# Patient Record
Sex: Female | Born: 1935 | Race: White | Hispanic: No | Smoking: Former smoker
Health system: Southern US, Community
[De-identification: ages and names within clinical notes are randomized; demographics above are authoritative.]

## PROBLEM LIST (undated history)

## (undated) DIAGNOSIS — R87619 Unspecified abnormal cytological findings in specimens from cervix uteri: Secondary | ICD-10-CM

## (undated) DIAGNOSIS — M2540 Effusion, unspecified joint: Secondary | ICD-10-CM

## (undated) DIAGNOSIS — R51 Headache: Secondary | ICD-10-CM

## (undated) DIAGNOSIS — K5790 Diverticulosis of intestine, part unspecified, without perforation or abscess without bleeding: Secondary | ICD-10-CM

## (undated) DIAGNOSIS — M2550 Pain in unspecified joint: Secondary | ICD-10-CM

## (undated) DIAGNOSIS — L299 Pruritus, unspecified: Secondary | ICD-10-CM

## (undated) DIAGNOSIS — M7071 Other bursitis of hip, right hip: Secondary | ICD-10-CM

## (undated) DIAGNOSIS — F329 Major depressive disorder, single episode, unspecified: Secondary | ICD-10-CM

## (undated) DIAGNOSIS — C44310 Basal cell carcinoma of skin of unspecified parts of face: Secondary | ICD-10-CM

## (undated) DIAGNOSIS — M549 Dorsalgia, unspecified: Secondary | ICD-10-CM

## (undated) DIAGNOSIS — A498 Other bacterial infections of unspecified site: Secondary | ICD-10-CM

## (undated) DIAGNOSIS — A048 Other specified bacterial intestinal infections: Secondary | ICD-10-CM

## (undated) DIAGNOSIS — L853 Xerosis cutis: Secondary | ICD-10-CM

## (undated) DIAGNOSIS — K219 Gastro-esophageal reflux disease without esophagitis: Secondary | ICD-10-CM

## (undated) DIAGNOSIS — M8580 Other specified disorders of bone density and structure, unspecified site: Secondary | ICD-10-CM

## (undated) DIAGNOSIS — K635 Polyp of colon: Secondary | ICD-10-CM

## (undated) DIAGNOSIS — C439 Malignant melanoma of skin, unspecified: Secondary | ICD-10-CM

## (undated) DIAGNOSIS — F419 Anxiety disorder, unspecified: Secondary | ICD-10-CM

## (undated) DIAGNOSIS — F32A Depression, unspecified: Secondary | ICD-10-CM

## (undated) DIAGNOSIS — M1990 Unspecified osteoarthritis, unspecified site: Secondary | ICD-10-CM

## (undated) DIAGNOSIS — M25551 Pain in right hip: Secondary | ICD-10-CM

## (undated) HISTORY — PX: TONSILLECTOMY: SHX5217

## (undated) HISTORY — PX: OTHER SURGICAL HISTORY: SHX169

## (undated) HISTORY — DX: Other bursitis of hip, right hip: M70.71

## (undated) HISTORY — DX: Pain in right hip: M25.551

## (undated) HISTORY — DX: Polyp of colon: K63.5

## (undated) HISTORY — DX: Diverticulosis of intestine, part unspecified, without perforation or abscess without bleeding: K57.90

## (undated) HISTORY — DX: Gastro-esophageal reflux disease without esophagitis: K21.9

## (undated) HISTORY — DX: Malignant melanoma of skin, unspecified: C43.9

## (undated) HISTORY — DX: Other bacterial infections of unspecified site: A49.8

## (undated) HISTORY — DX: Other specified disorders of bone density and structure, unspecified site: M85.80

## (undated) HISTORY — DX: Other specified bacterial intestinal infections: A04.8

## (undated) HISTORY — DX: Major depressive disorder, single episode, unspecified: F32.9

## (undated) HISTORY — DX: Unspecified abnormal cytological findings in specimens from cervix uteri: R87.619

## (undated) HISTORY — DX: Depression, unspecified: F32.A

## (undated) HISTORY — DX: Basal cell carcinoma of skin of unspecified parts of face: C44.310

---

## 1967-09-03 HISTORY — PX: HIP FRACTURE SURGERY: SHX118

## 1988-09-02 DIAGNOSIS — R87619 Unspecified abnormal cytological findings in specimens from cervix uteri: Secondary | ICD-10-CM

## 1988-09-02 HISTORY — DX: Unspecified abnormal cytological findings in specimens from cervix uteri: R87.619

## 1998-09-02 HISTORY — PX: MELANOMA EXCISION: SHX5266

## 1998-10-20 ENCOUNTER — Other Ambulatory Visit: Admission: RE | Admit: 1998-10-20 | Discharge: 1998-10-20 | Payer: Self-pay | Admitting: Family Medicine

## 1998-11-16 ENCOUNTER — Encounter: Payer: Self-pay | Admitting: Obstetrics and Gynecology

## 1998-11-16 ENCOUNTER — Ambulatory Visit (HOSPITAL_COMMUNITY): Admission: RE | Admit: 1998-11-16 | Discharge: 1998-11-16 | Payer: Self-pay | Admitting: Obstetrics and Gynecology

## 1999-09-03 DIAGNOSIS — C439 Malignant melanoma of skin, unspecified: Secondary | ICD-10-CM

## 1999-09-03 HISTORY — DX: Malignant melanoma of skin, unspecified: C43.9

## 1999-11-09 ENCOUNTER — Encounter: Admission: RE | Admit: 1999-11-09 | Discharge: 1999-11-09 | Payer: Self-pay | Admitting: Family Medicine

## 1999-11-09 ENCOUNTER — Encounter: Payer: Self-pay | Admitting: Family Medicine

## 2002-11-24 ENCOUNTER — Other Ambulatory Visit: Admission: RE | Admit: 2002-11-24 | Discharge: 2002-11-24 | Payer: Self-pay | Admitting: Unknown Physician Specialty

## 2003-09-03 DIAGNOSIS — A048 Other specified bacterial intestinal infections: Secondary | ICD-10-CM

## 2003-09-03 HISTORY — DX: Other specified bacterial intestinal infections: A04.8

## 2003-10-10 ENCOUNTER — Other Ambulatory Visit: Admission: RE | Admit: 2003-10-10 | Discharge: 2003-10-10 | Payer: Self-pay | Admitting: Family Medicine

## 2005-08-08 ENCOUNTER — Ambulatory Visit: Payer: Self-pay | Admitting: Internal Medicine

## 2005-09-02 DIAGNOSIS — K5790 Diverticulosis of intestine, part unspecified, without perforation or abscess without bleeding: Secondary | ICD-10-CM

## 2005-09-02 DIAGNOSIS — K635 Polyp of colon: Secondary | ICD-10-CM

## 2005-09-02 HISTORY — DX: Diverticulosis of intestine, part unspecified, without perforation or abscess without bleeding: K57.90

## 2005-09-02 HISTORY — DX: Polyp of colon: K63.5

## 2005-09-05 ENCOUNTER — Ambulatory Visit: Payer: Self-pay | Admitting: Internal Medicine

## 2005-09-05 ENCOUNTER — Encounter (INDEPENDENT_AMBULATORY_CARE_PROVIDER_SITE_OTHER): Payer: Self-pay | Admitting: *Deleted

## 2005-09-05 HISTORY — PX: ESOPHAGOGASTRODUODENOSCOPY: SHX1529

## 2005-09-05 LAB — HM COLONOSCOPY

## 2007-03-09 ENCOUNTER — Other Ambulatory Visit: Admission: RE | Admit: 2007-03-09 | Discharge: 2007-03-09 | Payer: Self-pay | Admitting: Family Medicine

## 2009-08-15 HISTORY — PX: COLONOSCOPY: SHX174

## 2009-08-15 LAB — HM COLONOSCOPY

## 2010-03-12 ENCOUNTER — Other Ambulatory Visit: Admission: RE | Admit: 2010-03-12 | Discharge: 2010-03-12 | Payer: Self-pay | Admitting: Family Medicine

## 2010-05-21 ENCOUNTER — Ambulatory Visit: Payer: Self-pay | Admitting: Obstetrics and Gynecology

## 2010-05-22 ENCOUNTER — Ambulatory Visit: Payer: Self-pay | Admitting: Obstetrics and Gynecology

## 2011-02-01 DIAGNOSIS — C44310 Basal cell carcinoma of skin of unspecified parts of face: Secondary | ICD-10-CM

## 2011-02-01 HISTORY — DX: Basal cell carcinoma of skin of unspecified parts of face: C44.310

## 2011-02-01 HISTORY — PX: MOHS SURGERY: SHX181

## 2011-03-20 ENCOUNTER — Encounter: Payer: Self-pay | Admitting: Family Medicine

## 2011-03-20 ENCOUNTER — Ambulatory Visit (INDEPENDENT_AMBULATORY_CARE_PROVIDER_SITE_OTHER): Payer: Medicare Other | Admitting: Family Medicine

## 2011-03-20 VITALS — BP 138/78 | HR 68 | Ht 65.0 in | Wt 131.0 lb

## 2011-03-20 DIAGNOSIS — E7800 Pure hypercholesterolemia, unspecified: Secondary | ICD-10-CM | POA: Insufficient documentation

## 2011-03-20 DIAGNOSIS — M76899 Other specified enthesopathies of unspecified lower limb, excluding foot: Secondary | ICD-10-CM

## 2011-03-20 DIAGNOSIS — M7061 Trochanteric bursitis, right hip: Secondary | ICD-10-CM

## 2011-03-20 DIAGNOSIS — F329 Major depressive disorder, single episode, unspecified: Secondary | ICD-10-CM

## 2011-03-20 LAB — COMPREHENSIVE METABOLIC PANEL
AST: 22 U/L (ref 0–37)
Albumin: 3.9 g/dL (ref 3.5–5.2)
Alkaline Phosphatase: 43 U/L (ref 39–117)
BUN: 18 mg/dL (ref 6–23)
Calcium: 9.1 mg/dL (ref 8.4–10.5)
Chloride: 103 mEq/L (ref 96–112)
Glucose, Bld: 81 mg/dL (ref 70–99)
Potassium: 4 mEq/L (ref 3.5–5.3)
Sodium: 139 mEq/L (ref 135–145)
Total Protein: 6.5 g/dL (ref 6.0–8.3)

## 2011-03-20 LAB — LIPID PANEL
HDL: 101 mg/dL (ref >39)
LDL Cholesterol: 114 mg/dL — ABNORMAL HIGH (ref 0–99)

## 2011-03-20 MED ORDER — FLUOXETINE HCL 40 MG PO CAPS: 40.0000 mg | ORAL_CAPSULE | Freq: Every day | ORAL | Status: DC

## 2011-03-20 NOTE — Progress Notes (Signed)
Patient presents to re-establish care. She has a history of depression, and has been on 40 mg of the Prozac. She believes her dose was increased from 20 to 40 mg 3 years ago (related to her husband's illness).  Lately she has been having "isolated epsodes of depression"--unsure if related to passing of her husband, or issues with someone she is dating now.  Doesn't cry, but gets irritable (usually regarding his sexual demands), then feels guilty afterwards.  She uses 1/2 Valium maybe once every 3 months, for anxiety, sometimes more.  Feels more comfortable having the bottle of medication on hand (requesting #30) even though she doesn't use it too often.  She is also requesting labs to be done, she is fasting today.  She has a h/o high total cholesterol, with high HDL and borderline LDL's in the 130's.  Her last CPE was a year ago.  No records received yet from [place] at [facility].  She saw Dr. Pete Glatter once with hip pain. She was referred to Dr. Jerl Santos who ordered hip x-rays and gave her a cortisone shot for bursitis. She had some improvement, but still having some pain.  Would prefer to not have to go back to Dr. Jerl Santos.  She likes the idea of "one stop shopping" with me.  Past Medical History  Diagnosis Date  . Melanoma 2001    back  . BCC (basal cell carcinoma), face 02/2011    nose  . Depression   . Bursitis of hip, right 04/2010    s/p cortisone shot (Dr. Jerl Santos)  . GERD (gastroesophageal reflux disease)   . Allergy   . Hearing loss     wears hearing aides     Past Surgical History  Procedure Date  . Mohs surgery 02/2011    for [facility]  . Melanoma excision 2001    at [facility]    History   Social History  . Marital Status: Widowed    Spouse Name: N/A    Number of Children: N/A  . Years of Education: N/A   Occupational History  . Not on file.   Social History Main Topics  . Smoking status: Former Smoker    Quit date: 09/02/1984  . Smokeless tobacco: Not on file  . Alcohol Use: Yes       1 drink per day  . Drug Use: No  . Sexually Active: Not on file   Other Topics Concern  . Not on file   Social History Narrative  . No narrative on file    Family History  Problem Relation Age of Onset  . Heart disease Father 21    MI  . Diabetes Neg Hx   . Cancer Neg Hx     Current outpatient prescriptions:diazepam (VALIUM) 5 MG tablet, Take 5 mg by mouth as needed.  , Disp: , Rfl: ;  FLUoxetine (PROZAC) 40 MG capsule, Take 1 capsule (40 mg total) by mouth daily., Disp: 90 capsule, Rfl: 3;  Multiple Vitamins-Minerals (MULTIVITAMIN WITH MINERALS) tablet, Take 1 tablet by mouth daily.  , Disp: , Rfl: ;  omeprazole (PRILOSEC) 20 MG capsule, Take 20 mg by mouth as needed.  , Disp: , Rfl:   No Known Allergies  ROS:  Denies headaches, chest pain, URI symptoms, SOB, other joint pains, swelling, GI complaints or other concerns  PHYSICAL EXAM:  Well developed, well nourished patient, in no distress BP 138/78  Pulse 68  Ht 5\' 5"  (1.651 m)  Wt 131 lb (59.421 kg)  BMI  21.80 kg/m2 HEENT: PERRL, EOMI, conjunctiva and sclera clear Neck: No lymphadenopathy or thyromegaly, no carotid bruit Heart:  Regular rate and rhythm, no murmurs, rubs, gallops or ectopy Lungs:  Clear bilaterally, without wheezes, rales or ronchi Abdomen:  Soft, nontender, nondistended, no hepatosplenomegaly or masses, normal bowel sounds Extremities:  No clubbing, cyanosis or edema, 2+ pulses. Tender over R trochanteric bursa Neuro:  Alert and oriented x 3, cranial nerves grossly intact.  DTR's 2+ and symmetric.  Normal strength and sensation Back:  No spine or CVA tenderness Skin: no rashes or suspicious lesions Psych:  Normal mood, affect, hygiene and grooming, normal speech, eye contact  ASSESSMENT/PLAN: 1. Depressive disorder, not elsewhere classified  FLUoxetine (PROZAC) 40 MG capsule   Stable overall.  Some relationship stress--continue open lines of communication with partner to work out issues.   Consider adding Wellbutrin if worse  2. Trochanteric bursitis of right hip     s/p 1 cortisone injection, with residual symptoms  3. Pure hypercholesterolemia  Comprehensive metabolic panel, Lipid panel   high HDL, borderline LDL (130's)   Patient is to call with the dose of her diazepam (wasn't sure if 5 or 10mg , and didn't want me to send in Rx for 5mg  without checking).  Consider adding Wellbutrin to the Prozac 40mg  if having ongoing problems with irritability, guilt, sadness.   F/u for CPE; to sign release for records from [place].  She may return sooner for cortisone injection for bursitis if she desires.

## 2011-03-20 NOTE — Patient Instructions (Signed)
Call with your valium dose when you get home (is it 5 or 10 mg)

## 2011-03-21 ENCOUNTER — Ambulatory Visit (INDEPENDENT_AMBULATORY_CARE_PROVIDER_SITE_OTHER): Payer: Medicare Other | Admitting: Family Medicine

## 2011-03-21 ENCOUNTER — Encounter: Payer: Self-pay | Admitting: Family Medicine

## 2011-03-21 VITALS — BP 118/72 | HR 68 | Ht 65.0 in | Wt 131.0 lb

## 2011-03-21 DIAGNOSIS — M76899 Other specified enthesopathies of unspecified lower limb, excluding foot: Secondary | ICD-10-CM

## 2011-03-21 DIAGNOSIS — F411 Generalized anxiety disorder: Secondary | ICD-10-CM

## 2011-03-21 DIAGNOSIS — M7061 Trochanteric bursitis, right hip: Secondary | ICD-10-CM

## 2011-03-21 MED ORDER — DIAZEPAM 10 MG PO TABS: 5.0000 mg | ORAL_TABLET | Freq: Three times a day (TID) | ORAL | Status: DC | PRN

## 2011-03-21 NOTE — Progress Notes (Signed)
  Subjective:    Patient ID: Tiffany Padilla, female    DOB: May 02, 1934, 77 y.o.   MRN: [id number]  HPI Patient presents for cortisone shot to treat bursitis in her R hip.  She had never called back with Valium dose.  She states she has 5mg  Rx at home, but that was told there was no difference in price for 10mg , so she'd rather have 10mg  dose and will cut tabs in half to save money.   Review of Systems Denies fevers, pain with weight bearing. Denies GI complaints, other joint pains, skin rashes, or other concerns    Objective:   Physical Exam BP 118/72  Pulse 68  Ht 5\' 5"  (1.651 m)  Wt 131 lb (59.421 kg)  BMI 21.80 kg/m2 Pleasant elderly female in no distress +TTP over R trochanteric bursa  Verbal consent was obtained after risks/benefits and alternatives were reviewed with patient, and she wished to proceed.  20mg  of Kenalog, along with 1cc of 2% lidocaine without epi and 1cc of Marcaine were injected into R trochanteric bursa (after applying Ethyl Chloride).  Patient tolerated the procedure well       Assessment & Plan:   1. Trochanteric bursitis of right hip   2. Anxiety state, unspecified      Discussed normal time frame of improvement, in relation to the 3 meds injected, and that it would take 7-14 days steroid to have full effect.  If she has incomplete improvement, or complete resolution but then gets recurrent pain, discussed use of NSAIDS first, prior to pain getting bad enough to warrant an injection.  She has a h/o PUD, so discussed using H2 blocker or PPI along with NSAID to protect stomach, if NSAIDS are needed in future.  Patient expresses understanding  Anxiety--Advised that quantity of diazepam would be decreased to #15, reflecting still 30 doses (which is what she previously has been rx'd--#30 of 5mg ).  Will fax to her mail order pharmacy as requested

## 2011-03-21 NOTE — Patient Instructions (Signed)
Hip Bursitis Bursitis is a swelling and soreness (inflammation) of a fluid-filled sac (bursa). This sac overlies and protects the joints.  CAUSES Bursitis can be caused by:  Injury.   Overuse of the muscles surrounding the joint.   Arthritis.   Gout.   Infection.   Cold weather.   Inadequate warm-up and conditioning prior to activities  The cause may be unknown. In the hip, it is a soreness of the bursa surrounding either of the big knobs of bone (trochanters) at the top of the thigh bone (femur).  SYMPTOMS Bursitis may vary from mild irritation to an abscess (also called a boil or furuncle). This can cause excruciating pain. The joints most likely to be affected by bursitis are the elbows, shoulders, hips and knees. Symptoms usually lessen in 3 to 4 weeks with treatment, but often come back. DIAGNOSIS Bursitis in the hip usually begins as tenderness and swelling over the outside of the hip. There is also pain with motion of the hip. If the bursa becomes infected, a temperature may be present. Redness and tenderness and warmth will develop over the hip. HOME CARE INSTRUCTIONS  Apply ice to the affected area for 10 minutes every 3-4 hours while awake for the first 2 days. Put the ice in a plastic bag and place a towel between the bag of ice and your skin.   Rest the painful joint as much as possible, but continue to put the joint through a normal range of motion at least four times per day. When the pain lessens, begin normal, slow movements and usual activities to help prevent stiffness of the hip.   Only take over-the-counter or prescription medicines for pain, discomfort, or fever as directed by your caregiver.   Use crutches to limit weight bearing on the hip joint, if advised.   Elevate your painful hip to reduce swelling. Use pillows for propping and cushioning your legs and hips.   Gentle massage may provide comfort and decrease swelling.   If conservative treatment does  not work, your caregiver may advise draining the bursa and injecting cortisone into the area. This may speed up the healing process. This may also be used as an initial treatment of choice.  SEEK IMMEDIATE MEDICAL CARE IF:  Your pain increases even during treatment, or you are not improving.   An unexplained oral temperature above 101 develops.   You have heat and inflammation over the involved bursa.   You have any other questions or concerns.  MAKE SURE YOU:   Understand these instructions.   Will watch your condition.   Will get help right away if you are not doing well or get worse.  Document Released: 02/08/2002 Document Re-Released: 11/15/2008 [facility] Patient Information 2011 [place], [place].

## 2011-04-29 ENCOUNTER — Encounter: Payer: Self-pay | Admitting: Family Medicine

## 2011-05-08 ENCOUNTER — Encounter: Payer: Self-pay | Admitting: *Deleted

## 2011-05-13 ENCOUNTER — Ambulatory Visit (INDEPENDENT_AMBULATORY_CARE_PROVIDER_SITE_OTHER): Payer: Medicare Other | Admitting: Family Medicine

## 2011-05-13 ENCOUNTER — Encounter: Payer: Self-pay | Admitting: Family Medicine

## 2011-05-13 VITALS — BP 112/70 | HR 64 | Ht 65.0 in | Wt 131.0 lb

## 2011-05-13 DIAGNOSIS — Z23 Encounter for immunization: Secondary | ICD-10-CM

## 2011-05-13 DIAGNOSIS — Z0000 Encounter for general adult medical examination without abnormal findings: Secondary | ICD-10-CM

## 2011-05-13 DIAGNOSIS — D126 Benign neoplasm of colon, unspecified: Secondary | ICD-10-CM | POA: Insufficient documentation

## 2011-05-13 DIAGNOSIS — F329 Major depressive disorder, single episode, unspecified: Secondary | ICD-10-CM

## 2011-05-13 LAB — POCT URINALYSIS DIPSTICK
Glucose, UA: NEGATIVE
Leukocytes, UA: NEGATIVE
Nitrite, UA: POSITIVE
Protein, UA: NEGATIVE
Urobilinogen, UA: NEGATIVE

## 2011-05-13 NOTE — Patient Instructions (Addendum)
HEALTH MAINTENANCE RECOMMENDATIONS:  It is recommended that you get at least 30 minutes of aerobic exercise at least 5 days/week (for weight loss, you may need as much as 60-90 minutes). This can be any activity that gets your heart rate up. This can be divided in 10-15 minute intervals if needed, but try and build up your endurance at least once a week.  Weight bearing exercise is also recommended twice weekly.  Eat a healthy diet with lots of vegetables, fruits and fiber.  "Colorful" foods have a lot of vitamins (ie green vegetables, tomatoes, red peppers, etc).  Limit sweet tea, regular sodas and alcoholic beverages, all of which has a lot of calories and sugar.  Up to 1 alcoholic drink daily may be beneficial for women (unless trying to lose weight, watch sugars).  Drink a lot of water.  Calcium recommendations are 1200-1500 mg daily (1500 mg for postmenopausal women or women without ovaries), and vitamin D 1000 IU daily.  This should be obtained from diet and/or supplements (vitamins), and calcium should not be taken all at once, but in divided doses.  Monthly self breast exams and yearly mammograms for women over the age of 46 is recommended.  Sunscreen of at least SPF 30 should be used on all sun-exposed parts of the skin when outside between the hours of 10 am and 4 pm (not just when at beach or pool, but even with exercise, golf, tennis, and yard work!)  Use a sunscreen that says "broad spectrum" so it covers both UVA and UVB rays, and make sure to reapply every 1-2 hours.  Remember to change the batteries in your smoke detectors when changing your clock times in the spring and fall.  Use your seat belt every time you are in a car, and please drive safely and not be distracted with cell phones and texting while driving.   Counseling is recommended--I will give you the card for Malvin Johns she doesn't take your insurance, then check with your insurance who in town takes your  insurance.  Consider getting TdaP vaccines (tetanus booster with pertussis booster also)--no need to wait for 10 years from your last Tetanus shot.  Mediterranean diets may be helpful in preventing Alzheimers disease and stroke.  Consider yearly mammograms, as recommended
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
## 2011-06-28 ENCOUNTER — Telehealth: Payer: Self-pay | Admitting: Family Medicine

## 2011-07-01 ENCOUNTER — Other Ambulatory Visit: Payer: Self-pay | Admitting: Family Medicine

## 2011-07-01 ENCOUNTER — Other Ambulatory Visit: Payer: Self-pay | Admitting: *Deleted

## 2011-07-01 DIAGNOSIS — K219 Gastro-esophageal reflux disease without esophagitis: Secondary | ICD-10-CM

## 2011-07-01 MED ORDER — OMEPRAZOLE 20 MG PO CPDR: 20.0000 mg | DELAYED_RELEASE_CAPSULE | Freq: Every day | ORAL | Status: DC

## 2011-07-05 NOTE — Telephone Encounter (Signed)
CLOSED BC 1 WK OLD

## 2011-11-11 ENCOUNTER — Ambulatory Visit (INDEPENDENT_AMBULATORY_CARE_PROVIDER_SITE_OTHER): Payer: Medicare Other | Admitting: Family Medicine

## 2011-11-11 ENCOUNTER — Encounter: Payer: Self-pay | Admitting: Family Medicine

## 2011-11-11 VITALS — BP 118/70 | HR 68 | Temp 98.2°F | Ht 65.0 in | Wt 140.0 lb

## 2011-11-11 DIAGNOSIS — M79609 Pain in unspecified limb: Secondary | ICD-10-CM

## 2011-11-11 DIAGNOSIS — M79672 Pain in left foot: Secondary | ICD-10-CM

## 2011-11-11 DIAGNOSIS — J309 Allergic rhinitis, unspecified: Secondary | ICD-10-CM

## 2011-11-11 DIAGNOSIS — M76899 Other specified enthesopathies of unspecified lower limb, excluding foot: Secondary | ICD-10-CM

## 2011-11-11 DIAGNOSIS — M7071 Other bursitis of hip, right hip: Secondary | ICD-10-CM

## 2011-11-11 NOTE — Patient Instructions (Addendum)
Left foot pain--bunion vs component of arthritis.  Recommended conservative treatment with tylenol, wide shoes, avoid heels.  Avoid NSAIDs for now (or use sparingly) due to h/o ulcer in past  Allergies--trial of claritin, or zyrtec or allegra (all over-the-counter).     Hip Bursitis Bursitis is a swelling and soreness (inflammation) of a fluid-filled sac (bursa). This sac overlies and protects the joints.  CAUSES   Injury.   Overuse of the muscles surrounding the joint.   Arthritis.   Gout.   Infection.   Cold weather.   Inadequate warm-up and conditioning prior to activities.  The cause may not be known.  SYMPTOMS   Mild to severe irritation.   Tenderness and swelling over the outside of the hip.   Pain with motion of the hip.   If the bursa becomes infected, a fever may be present. Redness, tenderness, and warmth will develop over the hip.  Symptoms usually lessen in 3 to 4 weeks with treatment, but can come back. TREATMENT If conservative treatment does not work, your caregiver may advise draining the bursa and injecting cortisone into the area. This may speed up the healing process. This may also be used as an initial treatment of choice. HOME CARE INSTRUCTIONS   Apply ice to the affected area for 15 to 20 minutes every 3 to 4 hours while awake for the first 2 days. Put the ice in a plastic bag and place a towel between the bag of ice and your skin.   Rest the painful joint as much as possible, but continue to put the joint through a normal range of motion at least 4 times per day. When the pain lessens, begin normal, slow movements and usual activities to help prevent stiffness of the hip.   Only take over-the-counter or prescription medicines for pain, discomfort, or fever as directed by your caregiver.   Use crutches to limit weight bearing on the hip joint, if advised.   Elevate your painful hip to reduce swelling. Use pillows for propping and cushioning your legs  and hips.   Gentle massage may provide comfort and decrease swelling.  SEEK IMMEDIATE MEDICAL CARE IF:   Your pain increases even during treatment, or you are not improving.   You have a fever.   You have heat and inflammation over the involved bursa.   You have any other questions or concerns.  MAKE SURE YOU:   Understand these instructions.   Will watch your condition.   Will get help right away if you are not doing well or get worse.  Document Released: 02/08/2002 Document Revised: 08/08/2011 Document Reviewed: 09/07/2008 [facility] Patient Information 2012 [place], [place].
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
## 2012-01-30 ENCOUNTER — Telehealth: Payer: Self-pay | Admitting: *Deleted

## 2012-01-30 DIAGNOSIS — K219 Gastro-esophageal reflux disease without esophagitis: Secondary | ICD-10-CM

## 2012-01-30 MED ORDER — OMEPRAZOLE 20 MG PO CPDR: 20.0000 mg | DELAYED_RELEASE_CAPSULE | Freq: Every day | ORAL | Status: DC

## 2012-01-30 NOTE — Telephone Encounter (Signed)
Done

## 2012-01-30 NOTE — Telephone Encounter (Signed)
No--only approve the 20mg .  If she needs stronger due to having problems/symptoms, needs to schedule OV

## 2012-01-30 NOTE — Telephone Encounter (Signed)
Patient called and was requesting her omeprazole be called into [facility], she has been on 20mg  in the past but would like to know if you could call something stronger in for her, perhaps a stronger strength?

## 2012-02-17 ENCOUNTER — Telehealth: Payer: Self-pay | Admitting: *Deleted

## 2012-02-17 NOTE — Telephone Encounter (Signed)
Spoke with patient and she would rather come here as her copay at derm is $40. She is scheduled for Thurs 02/20/12 as she was not able to come in [place].-appt was offered.

## 2012-02-17 NOTE — Telephone Encounter (Signed)
Patient called and stated that she had something removed from her back by her dermatologist and she thinks it may be infected. Wants to know if you would be willing to take a look at it or should she call her derm back?

## 2012-02-17 NOTE — Telephone Encounter (Signed)
My recommendation is to follow up with the dermatologist who did the procedure; however I' d be happy to evaluate here also

## 2012-02-20 ENCOUNTER — Ambulatory Visit (INDEPENDENT_AMBULATORY_CARE_PROVIDER_SITE_OTHER): Payer: Medicare Other | Admitting: Family Medicine

## 2012-02-20 ENCOUNTER — Encounter: Payer: Self-pay | Admitting: Family Medicine

## 2012-02-20 VITALS — BP 124/72 | HR 68 | Ht 65.0 in | Wt 142.0 lb

## 2012-02-20 DIAGNOSIS — Z5189 Encounter for other specified aftercare: Secondary | ICD-10-CM

## 2012-02-20 NOTE — Patient Instructions (Signed)
There is no evidence of infection, just healing.  There will continue to be changes in the wound appearance--the central yellow/hole area should get flatter, and the redness should fade (possibly to a darker color).  If there is increase in pain, redness, size, if there is drainage, swelling, red streaks for fevers, please return for re-evaluation.  It is fine to keep this area uncovered, but clean.  At the first sign of increased redness or oozing, start an antibacterial ointment like bacitracin, and then return here if doesn't resolve.
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
## 2012-04-20 ENCOUNTER — Other Ambulatory Visit: Payer: Self-pay | Admitting: *Deleted

## 2012-04-20 DIAGNOSIS — F329 Major depressive disorder, single episode, unspecified: Secondary | ICD-10-CM

## 2012-04-20 DIAGNOSIS — K219 Gastro-esophageal reflux disease without esophagitis: Secondary | ICD-10-CM

## 2012-04-20 MED ORDER — FLUOXETINE HCL 40 MG PO CAPS: 40.0000 mg | ORAL_CAPSULE | Freq: Every day | ORAL | Status: DC

## 2012-04-20 MED ORDER — OMEPRAZOLE 20 MG PO CPDR: 20.0000 mg | DELAYED_RELEASE_CAPSULE | Freq: Every day | ORAL | Status: DC

## 2012-04-29 ENCOUNTER — Other Ambulatory Visit: Payer: Self-pay | Admitting: *Deleted

## 2012-04-29 DIAGNOSIS — K219 Gastro-esophageal reflux disease without esophagitis: Secondary | ICD-10-CM

## 2012-04-29 MED ORDER — OMEPRAZOLE 20 MG PO CPDR: 20.0000 mg | DELAYED_RELEASE_CAPSULE | Freq: Every day | ORAL | Status: DC

## 2012-06-03 ENCOUNTER — Ambulatory Visit: Payer: Medicare Other | Admitting: Family Medicine

## 2012-06-08 ENCOUNTER — Ambulatory Visit (INDEPENDENT_AMBULATORY_CARE_PROVIDER_SITE_OTHER): Payer: Medicare Other | Admitting: Family Medicine

## 2012-06-08 ENCOUNTER — Encounter: Payer: Self-pay | Admitting: Family Medicine

## 2012-06-08 VITALS — BP 138/86 | HR 76 | Ht 64.25 in | Wt 134.0 lb

## 2012-06-08 DIAGNOSIS — Z0000 Encounter for general adult medical examination without abnormal findings: Secondary | ICD-10-CM

## 2012-06-08 DIAGNOSIS — Z131 Encounter for screening for diabetes mellitus: Secondary | ICD-10-CM

## 2012-06-08 DIAGNOSIS — F329 Major depressive disorder, single episode, unspecified: Secondary | ICD-10-CM

## 2012-06-08 DIAGNOSIS — N390 Urinary tract infection, site not specified: Secondary | ICD-10-CM

## 2012-06-08 DIAGNOSIS — E7800 Pure hypercholesterolemia, unspecified: Secondary | ICD-10-CM

## 2012-06-08 DIAGNOSIS — D126 Benign neoplasm of colon, unspecified: Secondary | ICD-10-CM

## 2012-06-08 DIAGNOSIS — Z23 Encounter for immunization: Secondary | ICD-10-CM

## 2012-06-08 LAB — POCT URINALYSIS DIPSTICK
Nitrite, UA: POSITIVE
Protein, UA: NEGATIVE
Urobilinogen, UA: NEGATIVE
pH, UA: 8

## 2012-06-08 LAB — LIPID PANEL: LDL Cholesterol: 162 mg/dL — ABNORMAL HIGH (ref 0–99)

## 2012-06-08 MED ORDER — FLUOXETINE HCL 40 MG PO CAPS: 40.0000 mg | ORAL_CAPSULE | Freq: Every day | ORAL | Status: DC

## 2012-06-08 MED ORDER — INFLUENZA VIRUS VACC SPLIT PF IM SUSP: 0.5000 mL | Freq: Once | INTRAMUSCULAR | Status: DC

## 2012-06-08 MED ORDER — DIAZEPAM 10 MG PO TABS: 5.0000 mg | ORAL_TABLET | Freq: Three times a day (TID) | ORAL | Status: DC | PRN
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
## 2012-06-08 NOTE — Patient Instructions (Signed)
HEALTH MAINTENANCE RECOMMENDATIONS:  It is recommended that you get at least 30 minutes of aerobic exercise at least 5 days/week (for weight loss, you may need as much as 60-90 minutes). This can be any activity that gets your heart rate up. This can be divided in 10-15 minute intervals if needed, but try and build up your endurance at least once a week.  Weight bearing exercise is also recommended twice weekly.  Eat a healthy diet with lots of vegetables, fruits and fiber.  "Colorful" foods have a lot of vitamins (ie green vegetables, tomatoes, red peppers, etc).  Limit sweet tea, regular sodas and alcoholic beverages, all of which has a lot of calories and sugar.  Up to 1 alcoholic drink daily may be beneficial for women (unless trying to lose weight, watch sugars).  Drink a lot of water.  Calcium recommendations are 1200-1500 mg daily (1500 mg for postmenopausal women or women without ovaries), and vitamin D 1000 IU daily.  This should be obtained from diet and/or supplements (vitamins), and calcium should not be taken all at once, but in divided doses.  Monthly self breast exams and yearly mammograms for women over the age of 49 is recommended.  Sunscreen of at least SPF 30 should be used on all sun-exposed parts of the skin when outside between the hours of 10 am and 4 pm (not just when at beach or pool, but even with exercise, golf, tennis, and yard work!)  Use a sunscreen that says "broad spectrum" so it covers both UVA and UVB rays, and make sure to reapply every 1-2 hours.  Remember to change the batteries in your smoke detectors when changing your clock times in the spring and fall.  Use your seat belt every time you are in a car, and please drive safely and not be distracted with cell phones and texting while driving.  Think about and discuss with family the DNR issues.  Avoid decongestants ("sinus" meds) which can raise blood pressure.  Use just guaifenesin and antihistamines, as well  as sinus rinses to help with your sinus symptoms.

## 2012-06-11 ENCOUNTER — Other Ambulatory Visit: Payer: Self-pay | Admitting: *Deleted

## 2012-06-11 DIAGNOSIS — N390 Urinary tract infection, site not specified: Secondary | ICD-10-CM

## 2012-06-11 LAB — CULTURE, URINE COMPREHENSIVE: Colony Count: >=100000

## 2012-06-11 MED ORDER — NITROFURANTOIN MONOHYD MACRO 100 MG PO CAPS: 100.0000 mg | ORAL_CAPSULE | Freq: Two times a day (BID) | ORAL | Status: DC

## 2012-06-11 NOTE — Progress Notes (Signed)
Quick Note:  Advise pt--E.coli UTI. Treat with macrobid 100mg  BID x 7 days. Return for recheck of urine only if her urinary symptoms persist after completing the abx. ______

## 2012-06-12 ENCOUNTER — Telehealth: Payer: Self-pay | Admitting: Family Medicine

## 2012-06-12 NOTE — Telephone Encounter (Signed)
LM

## 2012-07-17 ENCOUNTER — Telehealth: Payer: Self-pay | Admitting: Internal Medicine

## 2012-07-17 NOTE — Telephone Encounter (Signed)
Records were faxed over to [facility] care at no charge

## 2012-07-21 ENCOUNTER — Other Ambulatory Visit: Payer: Self-pay | Admitting: Family Medicine

## 2012-07-21 NOTE — Telephone Encounter (Signed)
Rx refill on Prozac. 

## 2012-07-21 NOTE — Telephone Encounter (Signed)
This was sent to [facility] for a year supply last month at her CPE. Check with pt if she is intending to change to HT (did she get the 90d sent last month to mail order?)

## 2012-07-21 NOTE — Telephone Encounter (Signed)
Pt said no she was staying with [facility]

## 2012-07-23 ENCOUNTER — Other Ambulatory Visit: Payer: Self-pay | Admitting: Family Medicine

## 2012-08-06 ENCOUNTER — Ambulatory Visit (INDEPENDENT_AMBULATORY_CARE_PROVIDER_SITE_OTHER): Payer: 59 | Admitting: Licensed Clinical Social Worker

## 2012-08-06 DIAGNOSIS — IMO0002 Reserved for concepts with insufficient information to code with codable children: Secondary | ICD-10-CM

## 2012-10-12 ENCOUNTER — Other Ambulatory Visit: Payer: Self-pay | Admitting: Family Medicine

## 2012-11-20 ENCOUNTER — Ambulatory Visit
Admission: RE | Admit: 2012-11-20 | Discharge: 2012-11-20 | Disposition: A | Discharge status: Home / Self Care | Payer: Medicare Other | Source: Ambulatory Visit | Attending: Medical | Admitting: Medical

## 2012-11-20 ENCOUNTER — Ambulatory Visit (INDEPENDENT_AMBULATORY_CARE_PROVIDER_SITE_OTHER): Payer: Medicare Other | Admitting: Medical

## 2012-11-20 ENCOUNTER — Encounter: Payer: Self-pay | Admitting: Medical

## 2012-11-20 VITALS — BP 130/80 | HR 58 | Temp 98.3°F | Resp 16 | Wt 140.0 lb

## 2012-11-20 DIAGNOSIS — M25512 Pain in left shoulder: Secondary | ICD-10-CM

## 2012-11-20 DIAGNOSIS — M549 Dorsalgia, unspecified: Secondary | ICD-10-CM

## 2012-11-20 DIAGNOSIS — M25519 Pain in unspecified shoulder: Secondary | ICD-10-CM

## 2012-11-20 MED ORDER — CYCLOBENZAPRINE HCL 5 MG PO TABS: ORAL_TABLET | ORAL | Status: DC

## 2012-11-20 NOTE — Progress Notes (Signed)
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  Left shoulder started hurting.  Yesterday worsening pain in left shoulder, stabbing pain.   Would last up to 10 minutes at a time.  [place]ent to [place] while out of town.  She does report sharp pain in left shoulder in remote past, attributed to heaviness of pocket book.   She denies any recent trauma or injury.   She did fill up a box of books, picked this up, no pain, but denies any other activity that may have stressed the shoulder.  She walks her dog, uses the dog leash in left hand, dog is 23lb. Denies numbness, tingling, weakness.  using some naprosyn OTC.   No other aggravating or relieving factors.    Objective: Gen: wd, wn, nad Skin:no erythema, ecchymosis Neck: mild left lateral neck tenderness, otherwise nontender, normal ROM MSK: tender left supraspinatus, mild anterior humerus tenderness, mild pain and some resistance with left shoulder passive ROM with shoulder abduction and flexion above 100 degrees, otherwise shoulder ROM and exam nontender, no laxity, no pain with special tests, no deformity, rest of left arm unremarkable. Back: upper left back with mild tenderness to palpation, otherwise back nontender Pulses normal Ext: no edema Neuro: normal UE strength, sensation, DTRs  Assessment: Encounter Diagnoses  Name Primary?  . Back pain Yes  . Shoulder joint pain, left    Plan: Discussed possible etiologies.   Exam suggests more of a supraspinatus and trapezius soreness, spasm, and strain, and possible shoulder issue such as arthritis or adhesive capsulitis.  Exam suggests more pain in upper left back than in the arm itself.  No other obvious problem.  Discussed stretching, ROM exercise, strengthening exercise.   Can use OTC Aleve 1-2 times daily prn, or can use Tylenol.  Flexeril for QHS prn given.  Discussed risks of sedation, fall precautions, but limit this to worse tenderness, QHS use.

## 2012-11-26 ENCOUNTER — Other Ambulatory Visit: Payer: Self-pay | Admitting: Dermatology

## 2012-12-24 ENCOUNTER — Ambulatory Visit (INDEPENDENT_AMBULATORY_CARE_PROVIDER_SITE_OTHER): Payer: Medicare Other | Admitting: Licensed Clinical Social Worker

## 2012-12-24 DIAGNOSIS — IMO0002 Reserved for concepts with insufficient information to code with codable children: Secondary | ICD-10-CM

## 2013-03-31 ENCOUNTER — Encounter: Payer: Self-pay | Admitting: Family Medicine

## 2013-03-31 ENCOUNTER — Ambulatory Visit (INDEPENDENT_AMBULATORY_CARE_PROVIDER_SITE_OTHER): Payer: Medicare Other | Admitting: Family Medicine

## 2013-03-31 VITALS — BP 130/84 | HR 80 | Ht 64.25 in | Wt 139.0 lb

## 2013-03-31 DIAGNOSIS — M76899 Other specified enthesopathies of unspecified lower limb, excluding foot: Secondary | ICD-10-CM

## 2013-03-31 DIAGNOSIS — M7061 Trochanteric bursitis, right hip: Secondary | ICD-10-CM

## 2013-03-31 MED ORDER — LIDOCAINE HCL 2 % IJ SOLN
1.0000 mL | Freq: Once | INTRAMUSCULAR | Status: AC
  Administered 2013-03-31: 20 mg via INTRADERMAL

## 2013-03-31 MED ORDER — BUPIVACAINE HCL (PF) 0.5 % IJ SOLN
1.0000 mL | Freq: Once | INTRAMUSCULAR | Status: AC
  Administered 2013-03-31: 1 mL

## 2013-03-31 MED ORDER — TRIAMCINOLONE ACETONIDE 40 MG/ML IJ SUSP
20.0000 mg | Freq: Once | INTRAMUSCULAR | Status: AC
  Administered 2013-03-31: 20 mg via INTRAMUSCULAR
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
## 2013-05-04 ENCOUNTER — Telehealth: Payer: Self-pay | Admitting: Family Medicine

## 2013-05-04 DIAGNOSIS — K219 Gastro-esophageal reflux disease without esophagitis: Secondary | ICD-10-CM

## 2013-05-04 MED ORDER — OMEPRAZOLE 20 MG PO CPDR: DELAYED_RELEASE_CAPSULE | ORAL | Status: DC

## 2013-05-04 NOTE — Telephone Encounter (Signed)
Per computer--she was given 90 with 3 refills (1 year) in 06/2012 (time of her last CPE) of fluoxetine--refill shouldn't be needed yet.  I sent rx for omeprazole.  She is due for CPE in October, and it doesn't appear that this is scheduled yet.  Needs to schedule

## 2013-05-06 ENCOUNTER — Telehealth: Payer: Self-pay | Admitting: *Deleted

## 2013-05-06 NOTE — Telephone Encounter (Signed)
Called patient and left message for her to return my call. 

## 2013-05-06 NOTE — Telephone Encounter (Signed)
Spoke with patient and she scheduled a med check for October 15,2014 @ 3:15pm. She told me that she doesn't really have any desire to have a physical done, just wants a med check visit to have her medications refilled. Just an FYI. Thanks.

## 2013-06-16 ENCOUNTER — Ambulatory Visit (INDEPENDENT_AMBULATORY_CARE_PROVIDER_SITE_OTHER): Payer: Medicare Other | Admitting: Family Medicine

## 2013-06-16 ENCOUNTER — Encounter: Payer: Self-pay | Admitting: Family Medicine

## 2013-06-16 VITALS — BP 122/78 | HR 64 | Ht 65.0 in | Wt 140.0 lb

## 2013-06-16 DIAGNOSIS — F3289 Other specified depressive episodes: Secondary | ICD-10-CM

## 2013-06-16 DIAGNOSIS — K219 Gastro-esophageal reflux disease without esophagitis: Secondary | ICD-10-CM

## 2013-06-16 DIAGNOSIS — Z79899 Other long term (current) drug therapy: Secondary | ICD-10-CM

## 2013-06-16 DIAGNOSIS — Z23 Encounter for immunization: Secondary | ICD-10-CM

## 2013-06-16 DIAGNOSIS — F411 Generalized anxiety disorder: Secondary | ICD-10-CM

## 2013-06-16 DIAGNOSIS — E7800 Pure hypercholesterolemia, unspecified: Secondary | ICD-10-CM

## 2013-06-16 DIAGNOSIS — F329 Major depressive disorder, single episode, unspecified: Secondary | ICD-10-CM

## 2013-06-16 MED ORDER — DIAZEPAM 10 MG PO TABS: 5.0000 mg | ORAL_TABLET | Freq: Three times a day (TID) | ORAL | Status: AC | PRN

## 2013-06-16 MED ORDER — FLUOXETINE HCL 40 MG PO CAPS: 40.0000 mg | ORAL_CAPSULE | Freq: Every day | ORAL | Status: AC

## 2013-06-16 MED ORDER — OMEPRAZOLE 20 MG PO CPDR: DELAYED_RELEASE_CAPSULE | ORAL | Status: AC

## 2013-06-16 NOTE — Patient Instructions (Addendum)
Diet for Gastroesophageal Reflux Disease, Adult Reflux (acid reflux) is when acid from your stomach flows up into the esophagus. When acid comes in contact with the esophagus, the acid causes irritation and soreness (inflammation) in the esophagus. When reflux happens often or so severely that it causes damage to the esophagus, it is called gastroesophageal reflux disease (GERD). Nutrition therapy can help ease the discomfort of GERD. FOODS OR DRINKS TO AVOID OR LIMIT  Smoking or chewing tobacco. Nicotine is one of the most potent stimulants to acid production in the gastrointestinal tract.  Caffeinated and decaffeinated coffee and black tea.  Regular or low-calorie carbonated beverages or energy drinks (caffeine-free carbonated beverages are allowed).   Strong spices, such as black pepper, white pepper, red pepper, cayenne, curry powder, and chili powder.  Peppermint or spearmint.  Chocolate.  High-fat foods, including meats and fried foods. Extra added fats including oils, butter, salad dressings, and nuts. Limit these to less than 8 tsp per day.  Fruits and vegetables if they are not tolerated, such as citrus fruits or tomatoes.  Alcohol.  Any food that seems to aggravate your condition. If you have questions regarding your diet, call your caregiver or a registered dietitian. OTHER THINGS THAT MAY HELP GERD INCLUDE:   Eating your meals slowly, in a relaxed setting.  Eating 5 to 6 small meals per day instead of 3 large meals.  Eliminating food for a period of time if it causes distress.  Not lying down until 3 hours after eating a meal.  Keeping the head of your bed raised 6 to 9 inches (15 to 23 cm) by using a foam wedge or blocks under the legs of the bed. Lying flat may make symptoms worse.  Being physically active. Weight loss may be helpful in reducing reflux in overweight or obese adults.  Wear loose fitting clothing EXAMPLE MEAL PLAN This meal plan is approximately  2,000 calories based on https://www.bernard.org/ meal planning guidelines. Breakfast   cup cooked oatmeal.  1 cup strawberries.  1 cup low-fat milk.  1 oz almonds. Snack  1 cup cucumber slices.  6 oz yogurt (made from low-fat or fat-free milk). Lunch  2 slice whole-wheat bread.  2 oz sliced [place].  2 tsp mayonnaise.  1 cup blueberries.  1 cup snap peas. Snack  6 whole-wheat crackers.  1 oz string cheese. Dinner   cup brown rice.  1 cup mixed veggies.  1 tsp olive oil.  3 oz grilled fish. Document Released: 08/19/2005 Document Revised: 11/11/2011 Document Reviewed: 07/05/2011 [facility] Patient Information 2014 [place], [place].   You are encouraged to continue to have physicals (including breast and pelvic exams) with discussion of preventative needs (ie bone density tests, immunizations, etc)  Your allergies might be better controlled by using a medication such as claritin or allegra (generic versions are fine) which are once daily, rather than using benadryl just once a day (which only works for 6 hours).
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
## 2013-06-22 ENCOUNTER — Other Ambulatory Visit: Payer: Medicare Other

## 2013-06-22 ENCOUNTER — Other Ambulatory Visit: Payer: Self-pay | Admitting: Family Medicine

## 2013-06-22 DIAGNOSIS — E7800 Pure hypercholesterolemia, unspecified: Secondary | ICD-10-CM

## 2013-06-22 DIAGNOSIS — Z79899 Other long term (current) drug therapy: Secondary | ICD-10-CM

## 2013-06-22 LAB — CBC WITH DIFFERENTIAL/PLATELET
Basophils Absolute: 0 10*3/uL (ref 0.0–0.1)
Basophils Relative: 1 % (ref 0–1)
Eosinophils Absolute: 0.1 10*3/uL (ref 0.0–0.7)
Eosinophils Relative: 3 % (ref 0–5)
HCT: 34.8 % — ABNORMAL LOW (ref 36.0–46.0)
Hemoglobin: 11.5 g/dL — ABNORMAL LOW (ref 12.0–15.0)
Lymphocytes Relative: 25 % (ref 12–46)
Lymphs Abs: 1.1 10*3/uL (ref 0.7–4.0)
MCH: 29.6 pg (ref 26.0–34.0)
MCHC: 33 g/dL (ref 30.0–36.0)
MCV: 89.5 fL (ref 78.0–100.0)
Monocytes Absolute: 0.5 10*3/uL (ref 0.1–1.0)
Monocytes Relative: 12 % (ref 3–12)
Neutro Abs: 2.6 10*3/uL (ref 1.7–7.7)
Neutrophils Relative %: 59 % (ref 43–77)
Platelets: 346 10*3/uL (ref 150–400)
RBC: 3.89 MIL/uL (ref 3.87–5.11)
RDW: 13 % (ref 11.5–15.5)
WBC: 4.3 10*3/uL (ref 4.0–10.5)

## 2013-06-23 LAB — IRON: Iron: 44 ug/dL (ref 42–145)

## 2013-06-23 LAB — LIPID PANEL
Cholesterol: 231 mg/dL — ABNORMAL HIGH (ref 0–200)
LDL Cholesterol: 111 mg/dL — ABNORMAL HIGH (ref 0–99)
Triglycerides: 70 mg/dL (ref <150)
VLDL: 14 mg/dL (ref 0–40)

## 2013-06-23 LAB — COMPREHENSIVE METABOLIC PANEL
ALT: 11 U/L (ref 0–35)
AST: 18 U/L (ref 0–37)
Albumin: 3.9 g/dL (ref 3.5–5.2)
CO2: 27 mEq/L (ref 19–32)
Calcium: 9.3 mg/dL (ref 8.4–10.5)
Chloride: 105 mEq/L (ref 96–112)
Creat: 0.75 mg/dL (ref 0.50–1.10)
Glucose, Bld: 92 mg/dL (ref 70–99)
Potassium: 4.7 mEq/L (ref 3.5–5.3)
Total Protein: 6.4 g/dL (ref 6.0–8.3)

## 2013-06-23 LAB — TSH: TSH: 0.872 u[IU]/mL (ref 0.350–4.500)

## 2013-06-23 LAB — VITAMIN B12: Vitamin B-12: 676 pg/mL (ref 211–911)

## 2013-06-23 LAB — MAGNESIUM: Magnesium: 2 mg/dL (ref 1.5–2.5)

## 2013-06-29 ENCOUNTER — Other Ambulatory Visit (INDEPENDENT_AMBULATORY_CARE_PROVIDER_SITE_OTHER): Payer: Medicare Other

## 2013-06-29 DIAGNOSIS — Z1211 Encounter for screening for malignant neoplasm of colon: Secondary | ICD-10-CM

## 2013-06-29 LAB — HEMOCCULT GUIAC POC 1CARD (OFFICE)
Card #3 Fecal Occult Blood, POC: NEGATIVE
Fecal Occult Blood, POC: NEGATIVE

## 2013-07-02 ENCOUNTER — Telehealth: Payer: Self-pay | Admitting: Family Medicine

## 2013-07-02 NOTE — Telephone Encounter (Signed)
She needs OV--please schedule for Monday.  Her hip pain was not addressed at all at her last visit 2 weeks ago.  Needs eval before ordering x-ray.  She clearly has had bursitis on previous exams, which would not show up on an x-ray.

## 2013-07-02 NOTE — Telephone Encounter (Signed)
L/m for pt to call back. Dr. Lynelle Doctor would like to see her Monday. Will try to call again before leaving today.

## 2013-07-02 NOTE — Telephone Encounter (Signed)
Pt called back has an appt for Monday at 11:45.

## 2013-07-02 NOTE — Telephone Encounter (Signed)
Pt called and stated that she continues to have issues with hip pain. She states that she has seen you about this before and has had cortisone shots. She is requesting that she get a xray. Pt was informed that you would not be in until Monday and was offered an appt with shane. Pt declined and said to just send you a note and she would wait until Monday. I again told her she could come in and see the provider in today, she stated that its was painful but she could wait until Monday. Please call pt and advise about getting an xray. Pt. Can be reached at [phone] or [phone].

## 2013-07-05 ENCOUNTER — Ambulatory Visit (INDEPENDENT_AMBULATORY_CARE_PROVIDER_SITE_OTHER): Payer: Medicare Other | Admitting: Family Medicine

## 2013-07-05 ENCOUNTER — Ambulatory Visit: Payer: Medicare Other | Admitting: Family Medicine

## 2013-07-05 ENCOUNTER — Encounter: Payer: Self-pay | Admitting: Family Medicine

## 2013-07-05 VITALS — BP 124/76 | HR 72 | Ht 64.5 in | Wt 140.0 lb

## 2013-07-05 DIAGNOSIS — Z7189 Other specified counseling: Secondary | ICD-10-CM

## 2013-07-05 DIAGNOSIS — M25551 Pain in right hip: Secondary | ICD-10-CM

## 2013-07-05 DIAGNOSIS — M25559 Pain in unspecified hip: Secondary | ICD-10-CM

## 2013-07-05 NOTE — Patient Instructions (Addendum)
Back off on some of the weight machines for 1-2 weeks.   Use heat and stretches as shown. You likely have mild underlying arthritis--use acetaminophen as needed for pain.  Okay to continue water exercises, elliptical (if it causes pain, choose the bike instead).
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
## 2013-07-06 ENCOUNTER — Encounter: Payer: Self-pay | Admitting: Family Medicine

## 2013-07-06 DIAGNOSIS — Z7189 Other specified counseling: Secondary | ICD-10-CM | POA: Insufficient documentation

## 2013-07-06 DIAGNOSIS — M25551 Pain in right hip: Secondary | ICD-10-CM | POA: Insufficient documentation

## 2013-08-24 DIAGNOSIS — M25551 Pain in right hip: Secondary | ICD-10-CM

## 2013-08-24 HISTORY — DX: Pain in right hip: M25.551

## 2013-09-01 ENCOUNTER — Encounter: Payer: Self-pay | Admitting: Internal Medicine

## 2013-09-22 ENCOUNTER — Encounter: Payer: Self-pay | Admitting: Internal Medicine

## 2013-10-21 ENCOUNTER — Other Ambulatory Visit: Payer: Self-pay | Admitting: Orthopedic Surgery

## 2013-10-26 ENCOUNTER — Encounter (HOSPITAL_COMMUNITY)
Admission: RE | Admit: 2013-10-26 | Discharge: 2013-10-26 | Disposition: A | Discharge status: Home / Self Care | Payer: Medicare Other | Source: Ambulatory Visit | Attending: Orthopedic Surgery | Admitting: Orthopedic Surgery

## 2013-10-26 ENCOUNTER — Other Ambulatory Visit: Payer: Self-pay | Admitting: Surgical

## 2013-10-26 ENCOUNTER — Other Ambulatory Visit (HOSPITAL_COMMUNITY): Payer: Self-pay

## 2013-10-26 ENCOUNTER — Encounter (HOSPITAL_COMMUNITY): Payer: Self-pay

## 2013-10-26 DIAGNOSIS — Z01818 Encounter for other preprocedural examination: Secondary | ICD-10-CM

## 2013-10-26 DIAGNOSIS — Z01812 Encounter for preprocedural laboratory examination: Secondary | ICD-10-CM | POA: Insufficient documentation

## 2013-10-26 HISTORY — DX: Anxiety disorder, unspecified: F41.9

## 2013-10-26 HISTORY — DX: Headache: R51

## 2013-10-26 HISTORY — DX: Xerosis cutis: L85.3

## 2013-10-26 HISTORY — DX: Effusion, unspecified joint: M25.40

## 2013-10-26 HISTORY — DX: Pain in unspecified joint: M25.50

## 2013-10-26 HISTORY — DX: Unspecified osteoarthritis, unspecified site: M19.90

## 2013-10-26 HISTORY — DX: Dorsalgia, unspecified: M54.9

## 2013-10-26 HISTORY — DX: Pruritus, unspecified: L29.9

## 2013-10-26 LAB — URINALYSIS, ROUTINE W REFLEX MICROSCOPIC
BILIRUBIN URINE: NEGATIVE
GLUCOSE, UA: NEGATIVE mg/dL
HGB URINE DIPSTICK: NEGATIVE
KETONES UR: NEGATIVE mg/dL
Nitrite: NEGATIVE
PROTEIN: NEGATIVE mg/dL
Specific Gravity, Urine: 1.011 (ref 1.005–1.030)
Urobilinogen, UA: 0.2 mg/dL (ref 0.0–1.0)
pH: 7 (ref 5.0–8.0)

## 2013-10-26 LAB — COMPREHENSIVE METABOLIC PANEL
ALT: 27 U/L (ref 0–35)
AST: 33 U/L (ref 0–37)
Albumin: 3.9 g/dL (ref 3.5–5.2)
Alkaline Phosphatase: 66 U/L (ref 39–117)
BUN: 15 mg/dL (ref 6–23)
CALCIUM: 9.2 mg/dL (ref 8.4–10.5)
CO2: 28 meq/L (ref 19–32)
CREATININE: 0.58 mg/dL (ref 0.50–1.10)
Chloride: 100 mEq/L (ref 96–112)
GFR calc Af Amer: >90 mL/min (ref >90)
GFR, EST NON AFRICAN AMERICAN: 87 mL/min — AB (ref >90)
Glucose, Bld: 93 mg/dL (ref 70–99)
Potassium: 4.4 mEq/L (ref 3.7–5.3)
Sodium: 141 mEq/L (ref 137–147)
Total Bilirubin: 0.4 mg/dL (ref 0.3–1.2)
Total Protein: 7.8 g/dL (ref 6.0–8.3)

## 2013-10-26 LAB — TYPE AND SCREEN
ABO/RH(D): A NEG
Antibody Screen: NEGATIVE

## 2013-10-26 LAB — URINE MICROSCOPIC-ADD ON

## 2013-10-26 LAB — PROTIME-INR
INR: 0.9 (ref 0.00–1.49)
PROTHROMBIN TIME: 12 s (ref 11.6–15.2)

## 2013-10-26 LAB — CBC
HCT: 38.3 % (ref 36.0–46.0)
Hemoglobin: 12.6 g/dL (ref 12.0–15.0)
MCH: 28.4 pg (ref 26.0–34.0)
MCHC: 32.9 g/dL (ref 30.0–36.0)
MCV: 86.3 fL (ref 78.0–100.0)
PLATELETS: 317 10*3/uL (ref 150–400)
RBC: 4.44 MIL/uL (ref 3.87–5.11)
RDW: 15.9 % — ABNORMAL HIGH (ref 11.5–15.5)
WBC: 7 10*3/uL (ref 4.0–10.5)

## 2013-10-26 LAB — ABO/RH: ABO/RH(D): A NEG

## 2013-10-26 LAB — SURGICAL PCR SCREEN
MRSA, PCR: NEGATIVE
Staphylococcus aureus: POSITIVE — AB

## 2013-10-26 LAB — APTT: aPTT: 27 seconds (ref 24–37)

## 2013-10-26 MED ORDER — CHLORHEXIDINE GLUCONATE 4 % EX LIQD: 60.0000 mL | Freq: Once | CUTANEOUS | Status: DC
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
## 2013-10-26 NOTE — Progress Notes (Signed)
Attempted to call Dr.Aluisio about pts UA RESULTS;OFFICE CLOSED AT 3:30,RECORDING SAID NOT TO LEAVE A MESSAGE BUT TO CALL BACK ON WED FEB 25

## 2013-10-26 NOTE — Pre-Procedure Instructions (Signed)
FINESSE FIELDER  10/26/2013   Your procedure is scheduled on:  Fri, Feb 27 @ 10:15 AM  Report to [facility] Stay Entrance A  at 8:15 AM.  Call this number if you have problems the morning of surgery: [phone]   Remember:   Do not eat food or drink liquids after midnight.   Take these medicines the morning of surgery with A SIP OF WATER: Diazepam(Valium),Prozac(Fluoxetine),and Omeprazole(Prilosec).                Stop taking your Ibuprofen.No Goody's,BC's,Aleve,Aspirin,Fish Oil,or any Herbal Medications   Do not wear jewelry, make-up or nail polish.  Do not wear lotions, powders, or perfumes. You may wear deodorant.  Do not shave 48 hours prior to surgery.   Do not bring valuables to the hospital.  [facility] is not responsible                  for any belongings or valuables.               Contacts, dentures or bridgework may not be worn into surgery.  Leave suitcase in the car. After surgery it may be brought to your room.  For patients admitted to the hospital, discharge time is determined by your                treatment team.                 Special Instructions:  [place] - Preparing for Surgery  Before surgery, you can play an important role.  Because skin is not sterile, your skin needs to be as free of germs as possible.  You can reduce the number of germs on you skin by washing with CHG (chlorahexidine gluconate) soap before surgery.  CHG is an antiseptic cleaner which kills germs and bonds with the skin to continue killing germs even after washing.  Please DO NOT use if you have an allergy to CHG or antibacterial soaps.  If your skin becomes reddened/irritated stop using the CHG and inform your nurse when you arrive at Short Stay.  Do not shave (including legs and underarms) for at least 48 hours prior to the first CHG shower.  You may shave your face.  Please follow these instructions carefully:   1.  Shower with CHG Soap the night before surgery and the                                 morning of Surgery.  2.  If you choose to wash your hair, wash your hair first as usual with your       normal shampoo.  3.  After you shampoo, rinse your hair and body thoroughly to remove the                      Shampoo.  4.  Use CHG as you would any other liquid soap.  You can apply chg directly       to the skin and wash gently with scrungie or a clean washcloth.  5.  Apply the CHG Soap to your body ONLY FROM THE NECK DOWN.        Do not use on open wounds or open sores.  Avoid contact with your eyes,       ears, mouth and genitals (private parts).  Wash genitals (private parts)  with your normal soap.  6.  Wash thoroughly, paying special attention to the area where your surgery        will be performed.  7.  Thoroughly rinse your body with warm water from the neck down.  8.  DO NOT shower/wash with your normal soap after using and rinsing off       the CHG Soap.  9.  Pat yourself dry with a clean towel.            10.  Wear clean pajamas.            11.  Place clean sheets on your bed the night of your first shower and do not        sleep with pets.  Day of Surgery  Do not apply any lotions/deoderants the morning of surgery.  Please wear clean clothes to the hospital/surgery center.     Please read over the following fact sheets that you were given: Pain Booklet, Coughing and Deep Breathing, Blood Transfusion Information, MRSA Information and Surgical Site Infection Prevention

## 2013-10-26 NOTE — Progress Notes (Signed)
Pt doesn't have a cardiologist  Denies ever having an echo/stress test/heart cath   Medical Md is Dr.Eve Tomi Bamberger  Denies EKG or CXR in past yr

## 2013-10-27 NOTE — Progress Notes (Signed)
Pt. Notified of PCR results and prescription called to [facility] on [address].

## 2013-10-28 MED ORDER — DEXAMETHASONE SODIUM PHOSPHATE 10 MG/ML IJ SOLN
10.0000 mg | Freq: Once | INTRAMUSCULAR | Status: AC
  Administered 2013-10-29: 10 mg via INTRAVENOUS
  Filled 2013-10-28: qty 1

## 2013-10-28 MED ORDER — CEFAZOLIN SODIUM-DEXTROSE 2-3 GM-% IV SOLR
2.0000 g | INTRAVENOUS | Status: AC
  Administered 2013-10-29: 2 g via INTRAVENOUS
  Filled 2013-10-28: qty 50

## 2013-10-28 MED ORDER — ACETAMINOPHEN 10 MG/ML IV SOLN: 1000.0000 mg | Freq: Once | INTRAVENOUS | Status: DC

## 2013-10-28 MED ORDER — BUPIVACAINE LIPOSOME 1.3 % IJ SUSP
20.0000 mL | Freq: Once | INTRAMUSCULAR | Status: DC
  Filled 2013-10-28: qty 20

## 2013-10-28 MED ORDER — TRANEXAMIC ACID 100 MG/ML IV SOLN
1000.0000 mg | INTRAVENOUS | Status: AC
  Administered 2013-10-29: 1000 mg via INTRAVENOUS
  Filled 2013-10-28: qty 10

## 2013-10-29 ENCOUNTER — Encounter (HOSPITAL_COMMUNITY): Payer: Self-pay | Admitting: Anesthesiology

## 2013-10-29 ENCOUNTER — Encounter (HOSPITAL_COMMUNITY): Payer: Medicare Other | Admitting: Anesthesiology

## 2013-10-29 ENCOUNTER — Inpatient Hospital Stay (HOSPITAL_COMMUNITY): Payer: Medicare Other

## 2013-10-29 ENCOUNTER — Inpatient Hospital Stay (HOSPITAL_COMMUNITY): Payer: Medicare Other | Admitting: Anesthesiology

## 2013-10-29 ENCOUNTER — Encounter (HOSPITAL_COMMUNITY)
Admission: RE | Disposition: A | Discharge status: Skilled Nursing Facility | Payer: Self-pay | Source: Ambulatory Visit | Attending: Orthopedic Surgery

## 2013-10-29 ENCOUNTER — Inpatient Hospital Stay (HOSPITAL_COMMUNITY)
Admission: RE | Admit: 2013-10-29 | Discharge: 2013-11-01 | DRG: 470 | Disposition: A | Discharge status: Skilled Nursing Facility | Payer: Medicare Other | Source: Ambulatory Visit | Attending: Orthopedic Surgery | Admitting: Orthopedic Surgery

## 2013-10-29 DIAGNOSIS — Z85820 Personal history of malignant melanoma of skin: Secondary | ICD-10-CM

## 2013-10-29 DIAGNOSIS — K219 Gastro-esophageal reflux disease without esophagitis: Secondary | ICD-10-CM | POA: Diagnosis present

## 2013-10-29 DIAGNOSIS — Z860100 Personal history of colon polyps, unspecified: Secondary | ICD-10-CM

## 2013-10-29 DIAGNOSIS — F329 Major depressive disorder, single episode, unspecified: Secondary | ICD-10-CM | POA: Diagnosis present

## 2013-10-29 DIAGNOSIS — Z8249 Family history of ischemic heart disease and other diseases of the circulatory system: Secondary | ICD-10-CM

## 2013-10-29 DIAGNOSIS — E7800 Pure hypercholesterolemia, unspecified: Secondary | ICD-10-CM | POA: Diagnosis present

## 2013-10-29 DIAGNOSIS — F3289 Other specified depressive episodes: Secondary | ICD-10-CM | POA: Diagnosis present

## 2013-10-29 DIAGNOSIS — M1610 Unilateral primary osteoarthritis, unspecified hip: Principal | ICD-10-CM | POA: Diagnosis present

## 2013-10-29 DIAGNOSIS — M169 Osteoarthritis of hip, unspecified: Principal | ICD-10-CM | POA: Diagnosis present

## 2013-10-29 DIAGNOSIS — F411 Generalized anxiety disorder: Secondary | ICD-10-CM | POA: Diagnosis present

## 2013-10-29 DIAGNOSIS — Z87891 Personal history of nicotine dependence: Secondary | ICD-10-CM

## 2013-10-29 DIAGNOSIS — Z85828 Personal history of other malignant neoplasm of skin: Secondary | ICD-10-CM

## 2013-10-29 HISTORY — PX: TOTAL HIP ARTHROPLASTY: SHX124

## 2013-10-29 SURGERY — ARTHROPLASTY, HIP, TOTAL, ANTERIOR APPROACH
Anesthesia: General | Site: Hip | Laterality: Right

## 2013-10-29 MED ORDER — METHOCARBAMOL 500 MG PO TABS: 500.0000 mg | ORAL_TABLET | Freq: Four times a day (QID) | ORAL | Status: DC | PRN

## 2013-10-29 MED ORDER — MIDAZOLAM HCL 2 MG/2ML IJ SOLN
INTRAMUSCULAR | Status: AC
  Filled 2013-10-29: qty 2

## 2013-10-29 MED ORDER — FLEET ENEMA 7-19 GM/118ML RE ENEM: 1.0000 | ENEMA | Freq: Once | RECTAL | Status: AC | PRN

## 2013-10-29 MED ORDER — PROPOFOL 10 MG/ML IV BOLUS
INTRAVENOUS | Status: DC | PRN
  Administered 2013-10-29: 150 mg via INTRAVENOUS

## 2013-10-29 MED ORDER — KETOROLAC TROMETHAMINE 15 MG/ML IJ SOLN
7.5000 mg | Freq: Four times a day (QID) | INTRAMUSCULAR | Status: AC | PRN
  Administered 2013-10-29: 7.5 mg via INTRAVENOUS

## 2013-10-29 MED ORDER — LACTATED RINGERS IV SOLN
INTRAVENOUS | Status: DC
  Administered 2013-10-29: 50 mL/h via INTRAVENOUS

## 2013-10-29 MED ORDER — FENTANYL CITRATE 0.05 MG/ML IJ SOLN
INTRAMUSCULAR | Status: DC | PRN
  Administered 2013-10-29 (×4): 50 ug via INTRAVENOUS
  Administered 2013-10-29: 150 ug via INTRAVENOUS

## 2013-10-29 MED ORDER — FLUOXETINE HCL 40 MG PO CAPS: 40.0000 mg | ORAL_CAPSULE | Freq: Every day | ORAL | Status: DC

## 2013-10-29 MED ORDER — ACETAMINOPHEN 325 MG PO TABS: 650.0000 mg | ORAL_TABLET | Freq: Four times a day (QID) | ORAL | Status: DC | PRN

## 2013-10-29 MED ORDER — OXYCODONE HCL 5 MG PO TABS
5.0000 mg | ORAL_TABLET | ORAL | Status: DC | PRN
  Administered 2013-10-29 – 2013-11-01 (×15): 10 mg via ORAL
  Filled 2013-10-29 (×15): qty 2

## 2013-10-29 MED ORDER — HYDROMORPHONE HCL PF 1 MG/ML IJ SOLN
0.2500 mg | INTRAMUSCULAR | Status: DC | PRN
  Administered 2013-10-29 (×3): 0.5 mg via INTRAVENOUS
  Administered 2013-10-29: 1 mg via INTRAVENOUS

## 2013-10-29 MED ORDER — MENTHOL 3 MG MT LOZG
1.0000 | LOZENGE | OROMUCOSAL | Status: DC | PRN
  Administered 2013-10-29: 3 mg via ORAL
  Filled 2013-10-29: qty 9

## 2013-10-29 MED ORDER — METHOCARBAMOL 500 MG PO TABS
500.0000 mg | ORAL_TABLET | Freq: Four times a day (QID) | ORAL | Status: DC | PRN
  Administered 2013-10-29 – 2013-11-01 (×10): 500 mg via ORAL
  Filled 2013-10-29 (×10): qty 1

## 2013-10-29 MED ORDER — HYDROMORPHONE HCL PF 1 MG/ML IJ SOLN
INTRAMUSCULAR | Status: AC
  Administered 2013-10-29: 14:00:00
  Filled 2013-10-29: qty 1

## 2013-10-29 MED ORDER — GLYCOPYRROLATE 0.2 MG/ML IJ SOLN
INTRAMUSCULAR | Status: AC
  Filled 2013-10-29: qty 5

## 2013-10-29 MED ORDER — METHOCARBAMOL 100 MG/ML IJ SOLN
500.0000 mg | Freq: Once | INTRAMUSCULAR | Status: AC
  Administered 2013-10-29 (×2): 500 mg via INTRAVENOUS
  Filled 2013-10-29: qty 5

## 2013-10-29 MED ORDER — DIAZEPAM 5 MG PO TABS: 5.0000 mg | ORAL_TABLET | Freq: Three times a day (TID) | ORAL | Status: DC | PRN

## 2013-10-29 MED ORDER — DEXAMETHASONE SODIUM PHOSPHATE 10 MG/ML IJ SOLN
10.0000 mg | Freq: Every day | INTRAMUSCULAR | Status: AC
  Filled 2013-10-29: qty 1

## 2013-10-29 MED ORDER — GLYCOPYRROLATE 0.2 MG/ML IJ SOLN
INTRAMUSCULAR | Status: DC | PRN
  Administered 2013-10-29: .7 mg via INTRAVENOUS

## 2013-10-29 MED ORDER — FLUOXETINE HCL 20 MG PO CAPS
40.0000 mg | ORAL_CAPSULE | Freq: Every day | ORAL | Status: DC
  Administered 2013-10-30 – 2013-11-01 (×3): 40 mg via ORAL
  Filled 2013-10-29 (×3): qty 2

## 2013-10-29 MED ORDER — LACTATED RINGERS IV SOLN
INTRAVENOUS | Status: DC | PRN
  Administered 2013-10-29 (×2): via INTRAVENOUS

## 2013-10-29 MED ORDER — ARTIFICIAL TEARS OP OINT
TOPICAL_OINTMENT | OPHTHALMIC | Status: DC | PRN
  Administered 2013-10-29: 1 via OPHTHALMIC

## 2013-10-29 MED ORDER — RIVAROXABAN 10 MG PO TABS
10.0000 mg | ORAL_TABLET | Freq: Every day | ORAL | Status: DC
Start: 2013-10-30 — End: 2013-11-01

## 2013-10-29 MED ORDER — FENTANYL CITRATE 0.05 MG/ML IJ SOLN
INTRAMUSCULAR | Status: AC
  Filled 2013-10-29: qty 5

## 2013-10-29 MED ORDER — ONDANSETRON HCL 4 MG PO TABS: 4.0000 mg | ORAL_TABLET | Freq: Four times a day (QID) | ORAL | Status: DC | PRN

## 2013-10-29 MED ORDER — SODIUM CHLORIDE 0.9 % IV SOLN
INTRAVENOUS | Status: DC
  Administered 2013-10-29: 20:00:00 via INTRAVENOUS

## 2013-10-29 MED ORDER — METOCLOPRAMIDE HCL 5 MG/ML IJ SOLN: 10.0000 mg | Freq: Once | INTRAMUSCULAR | Status: DC | PRN

## 2013-10-29 MED ORDER — MIDAZOLAM HCL 5 MG/5ML IJ SOLN
INTRAMUSCULAR | Status: DC | PRN
  Administered 2013-10-29: 2 mg via INTRAVENOUS

## 2013-10-29 MED ORDER — ROCURONIUM BROMIDE 100 MG/10ML IV SOLN
INTRAVENOUS | Status: DC | PRN
  Administered 2013-10-29: 40 mg via INTRAVENOUS
  Administered 2013-10-29 (×2): 10 mg via INTRAVENOUS

## 2013-10-29 MED ORDER — ONDANSETRON HCL 4 MG/2ML IJ SOLN: 4.0000 mg | Freq: Four times a day (QID) | INTRAMUSCULAR | Status: DC | PRN

## 2013-10-29 MED ORDER — DIPHENHYDRAMINE HCL 12.5 MG/5ML PO ELIX: 12.5000 mg | ORAL_SOLUTION | ORAL | Status: DC | PRN

## 2013-10-29 MED ORDER — METOCLOPRAMIDE HCL 10 MG PO TABS: 5.0000 mg | ORAL_TABLET | Freq: Three times a day (TID) | ORAL | Status: DC | PRN

## 2013-10-29 MED ORDER — SODIUM CHLORIDE 0.9 % IJ SOLN
INTRAMUSCULAR | Status: DC | PRN
  Administered 2013-10-29: 12:00:00

## 2013-10-29 MED ORDER — PROPOFOL 10 MG/ML IV BOLUS
INTRAVENOUS | Status: AC
  Filled 2013-10-29: qty 20

## 2013-10-29 MED ORDER — KETOROLAC TROMETHAMINE 30 MG/ML IJ SOLN
INTRAMUSCULAR | Status: AC
  Administered 2013-10-29: 13:00:00
  Filled 2013-10-29: qty 1

## 2013-10-29 MED ORDER — OXYCODONE HCL 5 MG PO TABS
ORAL_TABLET | ORAL | Status: AC
  Administered 2013-10-29: 13:00:00
  Filled 2013-10-29: qty 2

## 2013-10-29 MED ORDER — PHENYLEPHRINE HCL 10 MG/ML IJ SOLN
INTRAMUSCULAR | Status: DC | PRN
  Administered 2013-10-29: 80 ug via INTRAVENOUS
  Administered 2013-10-29: 120 ug via INTRAVENOUS

## 2013-10-29 MED ORDER — ROCURONIUM BROMIDE 50 MG/5ML IV SOLN
INTRAVENOUS | Status: AC
  Filled 2013-10-29: qty 1

## 2013-10-29 MED ORDER — PANTOPRAZOLE SODIUM 40 MG PO TBEC
40.0000 mg | DELAYED_RELEASE_TABLET | Freq: Every day | ORAL | Status: DC
  Administered 2013-10-30 – 2013-11-01 (×3): 40 mg via ORAL
  Filled 2013-10-29 (×2): qty 1

## 2013-10-29 MED ORDER — MORPHINE SULFATE 2 MG/ML IJ SOLN
1.0000 mg | INTRAMUSCULAR | Status: DC | PRN
  Administered 2013-10-29 – 2013-10-30 (×5): 2 mg via INTRAVENOUS
  Filled 2013-10-29 (×5): qty 1

## 2013-10-29 MED ORDER — METOCLOPRAMIDE HCL 5 MG/ML IJ SOLN: 5.0000 mg | Freq: Three times a day (TID) | INTRAMUSCULAR | Status: DC | PRN

## 2013-10-29 MED ORDER — OXYCODONE HCL 5 MG PO TABS: 5.0000 mg | ORAL_TABLET | ORAL | Status: DC | PRN

## 2013-10-29 MED ORDER — OXYCODONE HCL 5 MG/5ML PO SOLN: 5.0000 mg | Freq: Once | ORAL | Status: DC | PRN

## 2013-10-29 MED ORDER — HYDROMORPHONE HCL PF 1 MG/ML IJ SOLN
INTRAMUSCULAR | Status: AC
  Administered 2013-10-29: 13:00:00
  Filled 2013-10-29: qty 1

## 2013-10-29 MED ORDER — PHENOL 1.4 % MT LIQD: 1.0000 | OROMUCOSAL | Status: DC | PRN

## 2013-10-29 MED ORDER — ACETAMINOPHEN 500 MG PO TABS
1000.0000 mg | ORAL_TABLET | Freq: Four times a day (QID) | ORAL | Status: AC
  Administered 2013-10-29 – 2013-10-30 (×4): 1000 mg via ORAL
  Filled 2013-10-29 (×4): qty 2

## 2013-10-29 MED ORDER — RIVAROXABAN 10 MG PO TABS
10.0000 mg | ORAL_TABLET | Freq: Every day | ORAL | Status: DC
  Administered 2013-10-30 – 2013-11-01 (×3): 10 mg via ORAL
  Filled 2013-10-29 (×4): qty 1

## 2013-10-29 MED ORDER — ONDANSETRON HCL 4 MG/2ML IJ SOLN
INTRAMUSCULAR | Status: DC | PRN
  Administered 2013-10-29: 4 mg via INTRAVENOUS

## 2013-10-29 MED ORDER — POLYETHYLENE GLYCOL 3350 17 G PO PACK: 17.0000 g | PACK | Freq: Every day | ORAL | Status: DC | PRN

## 2013-10-29 MED ORDER — SODIUM CHLORIDE 0.9 % IJ SOLN
INTRAMUSCULAR | Status: AC
  Filled 2013-10-29: qty 3

## 2013-10-29 MED ORDER — CEFAZOLIN SODIUM 1-5 GM-% IV SOLN
1.0000 g | Freq: Four times a day (QID) | INTRAVENOUS | Status: AC
  Administered 2013-10-29 (×2): 1 g via INTRAVENOUS
  Filled 2013-10-29 (×3): qty 50

## 2013-10-29 MED ORDER — NEOSTIGMINE METHYLSULFATE 1 MG/ML IJ SOLN
INTRAMUSCULAR | Status: DC | PRN
  Administered 2013-10-29: 3.5 mg via INTRAVENOUS

## 2013-10-29 MED ORDER — BISACODYL 10 MG RE SUPP
10.0000 mg | Freq: Every day | RECTAL | Status: DC | PRN
  Administered 2013-10-31: 10 mg via RECTAL
  Filled 2013-10-29: qty 1

## 2013-10-29 MED ORDER — SODIUM CHLORIDE 0.9 % IV SOLN: INTRAVENOUS | Status: DC

## 2013-10-29 MED ORDER — ONDANSETRON HCL 4 MG/2ML IJ SOLN
INTRAMUSCULAR | Status: AC
  Filled 2013-10-29: qty 2

## 2013-10-29 MED ORDER — OXYCODONE HCL 5 MG PO TABS: 5.0000 mg | ORAL_TABLET | Freq: Once | ORAL | Status: DC | PRN

## 2013-10-29 MED ORDER — ACETAMINOPHEN 650 MG RE SUPP: 650.0000 mg | Freq: Four times a day (QID) | RECTAL | Status: DC | PRN

## 2013-10-29 MED ORDER — METHOCARBAMOL 100 MG/ML IJ SOLN
500.0000 mg | Freq: Four times a day (QID) | INTRAMUSCULAR | Status: DC | PRN
  Filled 2013-10-29: qty 5

## 2013-10-29 MED ORDER — NEOSTIGMINE METHYLSULFATE 1 MG/ML IJ SOLN
INTRAMUSCULAR | Status: AC
  Filled 2013-10-29: qty 10

## 2013-10-29 MED ORDER — DEXAMETHASONE 6 MG PO TABS
10.0000 mg | ORAL_TABLET | Freq: Every day | ORAL | Status: AC
  Administered 2013-10-30: 10 mg via ORAL
  Filled 2013-10-29: qty 1

## 2013-10-29 MED ORDER — BUPIVACAINE HCL (PF) 0.25 % IJ SOLN
INTRAMUSCULAR | Status: AC
  Filled 2013-10-29: qty 30

## 2013-10-29 MED ORDER — 0.9 % SODIUM CHLORIDE (POUR BTL) OPTIME
TOPICAL | Status: DC | PRN
  Administered 2013-10-29: 1000 mL

## 2013-10-29 MED ORDER — BUPIVACAINE HCL (PF) 0.25 % IJ SOLN
INTRAMUSCULAR | Status: DC | PRN
  Administered 2013-10-29: 20 mL

## 2013-10-29 MED ORDER — DOCUSATE SODIUM 100 MG PO CAPS
100.0000 mg | ORAL_CAPSULE | Freq: Two times a day (BID) | ORAL | Status: DC
  Administered 2013-10-30 – 2013-11-01 (×5): 100 mg via ORAL
  Filled 2013-10-29 (×6): qty 1

## 2013-10-29 MED ORDER — MUPIROCIN 2 % EX OINT
TOPICAL_OINTMENT | CUTANEOUS | Status: AC
  Administered 2013-10-29: 09:00:00
  Filled 2013-10-29: qty 22

## 2013-10-29 MED ORDER — LIDOCAINE HCL (CARDIAC) 20 MG/ML IV SOLN
INTRAVENOUS | Status: DC | PRN
  Administered 2013-10-29: 80 mg via INTRAVENOUS

## 2013-10-29 MED ORDER — MUPIROCIN 2 % EX OINT
TOPICAL_OINTMENT | Freq: Two times a day (BID) | CUTANEOUS | Status: DC
  Administered 2013-10-29: 1 via NASAL
  Administered 2013-10-29 – 2013-11-01 (×6): via NASAL
  Filled 2013-10-29 (×3): qty 22

## 2013-10-29 SURGICAL SUPPLY — 42 items
BLADE SAW SGTL 18X1.27X75 (BLADE) ×2 IMPLANT
BLADE SAW SGTL 18X1.27X75MM (BLADE) ×1
CAPT HIP PF MOP ×3 IMPLANT
CLOSURE STERI-STRIP 1/2X4 (GAUZE/BANDAGES/DRESSINGS) ×1
CLOTH BEACON ORANGE TIMEOUT ST (SAFETY) ×3 IMPLANT
CLSR STERI-STRIP ANTIMIC 1/2X4 (GAUZE/BANDAGES/DRESSINGS) ×2 IMPLANT
DECANTER SPIKE VIAL GLASS SM (MISCELLANEOUS) ×3 IMPLANT
DRAPE C-ARM 42X72 X-RAY (DRAPES) ×3 IMPLANT
DRAPE STERI IOBAN 125X83 (DRAPES) ×3 IMPLANT
DRAPE U-SHAPE 47X51 STRL (DRAPES) ×9 IMPLANT
DRSG ADAPTIC 3X8 NADH LF (GAUZE/BANDAGES/DRESSINGS) ×3 IMPLANT
DRSG MEPILEX BORDER 4X4 (GAUZE/BANDAGES/DRESSINGS) ×3 IMPLANT
DRSG MEPILEX BORDER 4X8 (GAUZE/BANDAGES/DRESSINGS) ×3 IMPLANT
DURAPREP 26ML APPLICATOR (WOUND CARE) ×3 IMPLANT
ELECT BLADE 6.5 EXT (BLADE) ×3 IMPLANT
ELECT REM PT RETURN 9FT ADLT (ELECTROSURGICAL) ×3
ELECTRODE REM PT RTRN 9FT ADLT (ELECTROSURGICAL) ×1 IMPLANT
EVACUATOR 1/8 PVC DRAIN (DRAIN) ×3 IMPLANT
FACESHIELD LNG OPTICON STERILE (SAFETY) ×12 IMPLANT
GLOVE BIO SURGEON STRL SZ8 (GLOVE) ×6 IMPLANT
GLOVE BIOGEL PI IND STRL 8 (GLOVE) ×1 IMPLANT
GLOVE BIOGEL PI INDICATOR 8 (GLOVE) ×2
GLOVE ECLIPSE 8.0 STRL XLNG CF (GLOVE) ×3 IMPLANT
GOWN STRL NON-REIN LRG LVL3 (GOWN DISPOSABLE) ×3 IMPLANT
GOWN STRL REIN XL XLG (GOWN DISPOSABLE) ×3 IMPLANT
KIT BASIN OR (CUSTOM PROCEDURE TRAY) ×3 IMPLANT
NDL SAFETY ECLIPSE 18X1.5 (NEEDLE) ×1 IMPLANT
NEEDLE 18GX1X1/2 (RX/OR ONLY) (NEEDLE) ×3 IMPLANT
NEEDLE 22X1 1/2 (OR ONLY) (NEEDLE) ×3 IMPLANT
NEEDLE HYPO 18GX1.5 SHARP (NEEDLE) ×2
PACK TOTAL JOINT (CUSTOM PROCEDURE TRAY) ×3 IMPLANT
SUT ETHIBOND NAB CT1 #1 30IN (SUTURE) ×9 IMPLANT
SUT MNCRL AB 4-0 PS2 18 (SUTURE) ×3 IMPLANT
SUT VIC AB 1 CT1 27 (SUTURE) ×2
SUT VIC AB 1 CT1 27XBRD ANTBC (SUTURE) ×1 IMPLANT
SUT VIC AB 2-0 CT1 27 (SUTURE) ×4
SUT VIC AB 2-0 CT1 TAPERPNT 27 (SUTURE) ×2 IMPLANT
SUT VLOC 180 0 24IN GS25 (SUTURE) ×3 IMPLANT
SYR 20CC LL (SYRINGE) ×3 IMPLANT
SYR 50ML LL SCALE MARK (SYRINGE) ×3 IMPLANT
TOWEL OR 17X26 10 PK STRL BLUE (TOWEL DISPOSABLE) ×6 IMPLANT
TRAY FOLEY CATH 14FRSI W/METER (CATHETERS) ×3 IMPLANT

## 2013-10-29 NOTE — Anesthesia Postprocedure Evaluation (Signed)
Anesthesia Post Note  Patient: Tiffany Padilla  Procedure(s) Performed: Procedure(s) (LRB): RIGHT TOTAL HIP ARTHROPLASTY ANTERIOR APPROACH (Right)  Anesthesia type: General  Patient location: PACU  Post pain: Pain level controlled  Post assessment: Patient's Cardiovascular Status Stable  Last Vitals:  Filed Vitals:   10/29/13 1216  BP: 191/89  Pulse:   Temp: 36.3 C  Resp:     Post vital signs: Reviewed and stable  Level of consciousness: alert  Complications: No apparent anesthesia complications

## 2013-10-29 NOTE — Progress Notes (Signed)
Pt placed l hearing aid

## 2013-10-29 NOTE — Anesthesia Procedure Notes (Signed)
Procedure Name: Intubation Date/Time: 10/29/2013 10:35 AM Performed by: Neldon Newport Pre-anesthesia Checklist: Patient identified, Emergency Drugs available, Suction available, Patient being monitored and Timeout performed Patient Re-evaluated:Patient Re-evaluated prior to inductionOxygen Delivery Method: Circle system utilized Preoxygenation: Pre-oxygenation with 100% oxygen Intubation Type: IV induction Ventilation: Mask ventilation without difficulty Laryngoscope Size: Mac and 3 Grade View: Grade II Tube type: Oral Tube size: 7.5 mm Number of attempts: 1 Placement Confirmation: ETT inserted through vocal cords under direct vision,  positive ETCO2 and breath sounds checked- equal and bilateral Secured at: 22 cm Tube secured with: Tape Dental Injury: Teeth and Oropharynx as per pre-operative assessment

## 2013-10-29 NOTE — Anesthesia Preprocedure Evaluation (Addendum)
Anesthesia Evaluation  Patient identified by MRN, date of birth, ID band Patient awake    Reviewed: Allergy & Precautions, H&P , NPO status , Patient's Chart, lab work & pertinent test results, reviewed documented beta blocker date and time   Airway Mallampati: II TM Distance: >3 FB Neck ROM: full    Dental   Pulmonary neg pulmonary ROS, former smoker,  breath sounds clear to auscultation        Cardiovascular negative cardio ROS  Rhythm:regular     Neuro/Psych  Headaches, PSYCHIATRIC DISORDERS negative neurological ROS  negative psych ROS   GI/Hepatic Neg liver ROS, PUD, GERD-  Medicated and Controlled,  Endo/Other  negative endocrine ROS  Renal/GU negative Renal ROS  negative genitourinary   Musculoskeletal   Abdominal   Peds  Hematology negative hematology ROS (+)   Anesthesia Other Findings See surgeon's H&P   Reproductive/Obstetrics negative OB ROS                          Anesthesia Physical Anesthesia Plan  ASA: II  Anesthesia Plan: General   Post-op Pain Management:    Induction: Intravenous  Airway Management Planned: Oral ETT  Additional Equipment:   Intra-op Plan:   Post-operative Plan: Extubation in OR  Informed Consent: I have reviewed the patients History and Physical, chart, labs and discussed the procedure including the risks, benefits and alternatives for the proposed anesthesia with the patient or authorized representative who has indicated his/her understanding and acceptance.   Dental Advisory Given  Plan Discussed with: CRNA, Surgeon and Anesthesiologist  Anesthesia Plan Comments:        Anesthesia Quick Evaluation

## 2013-10-29 NOTE — Progress Notes (Signed)
Utilization review completed.  

## 2013-10-29 NOTE — Discharge Instructions (Addendum)
°Dr. Frank Aluisio °Total Joint Specialist °[facility] °[address] °[place], [place] [address] °[phone] ° ° ° °ANTERIOR APPROACH TOTAL HIP REPLACEMENT POSTOPERATIVE DIRECTIONS ° ° °Hip Rehabilitation, Guidelines Following Surgery  °The results of a hip operation are greatly improved after range of motion and muscle strengthening exercises. Follow all safety measures which are given to protect your hip. If any of these exercises cause increased pain or swelling in your joint, decrease the amount until you are comfortable again. Then slowly increase the exercises. Call your caregiver if you have problems or questions.  °HOME CARE INSTRUCTIONS  °Most of the following instructions are designed to prevent the dislocation of your new hip.  °Remove items at home which could result in a fall. This includes throw rugs or furniture in walking pathways.  °Continue medications as instructed at time of discharge. °· You may have some home medications which will be placed on hold until you complete the course of blood thinner medication. °· You may start showering once you are discharged home but do not submerge the incision under water. Just pat the incision dry and apply a dry gauze dressing on daily. °Do not put on socks or shoes without following the instructions of your caregivers.  °Sit on high chairs which makes it easier to stand.  °Sit on chairs with arms. Use the chair arms to help push yourself up when arising.  °Keep your leg on the side of the operation out in front of you when standing up.  °Arrange for the use of a toilet seat elevator so you are not sitting low.   °· Walk with walker as instructed.  °You may resume a sexual relationship in one month or when given the OK by your caregiver.  °Use walker as long as suggested by your caregivers.  °You may put full weight on your legs and walk as much as is comfortable. °Avoid periods of inactivity such as sitting longer than an hour  when not asleep. This helps prevent blood clots.  °You may return to work once you are cleared by your surgeon.  °Do not drive a car for 6 weeks or until released by your surgeon.  °Do not drive while taking narcotics.  °Wear elastic stockings for three weeks following surgery during the day but you may remove then at night.  °Make sure you keep all of your appointments after your operation with all of your doctors and caregivers. You should call the office at the above phone number and make an appointment for approximately two weeks after the date of your surgery. °Change the dressing daily and reapply a dry dressing each time. °Please pick up a stool softener and laxative for home use as long as you are requiring pain medications. °· Continue to use ice on the hip for pain and swelling from surgery. You may notice swelling that will progress down to the foot and ankle.  This is normal after  surgery.  Elevate the leg when you are not up walking on it.   °It is important for you to complete the blood thinner medication as prescribed by your doctor. °· Continue to use the breathing machine which will help keep your temperature down.  It is common for your temperature to cycle up and down following surgery, especially at night when you are not up moving around and exerting yourself.  The breathing machine keeps your lungs expanded and your temperature down. ° °RANGE OF MOTION AND STRENGTHENING EXERCISES  °  These exercises are designed to help you keep full movement of your hip joint. Follow your caregiver's or physical therapist's instructions. Perform all exercises about fifteen times, three times per day or as directed. Exercise both hips, even if you have had only one joint replacement. These exercises can be done on a training (exercise) mat, on the floor, on a table or on a bed. Use whatever works the best and is most comfortable for you. Use music or television while you are exercising so that the exercises are  a pleasant break in your day. This will make your life better with the exercises acting as a break in routine you can look forward to.  Lying on your back, slowly slide your foot toward your buttocks, raising your knee up off the floor. Then slowly slide your foot back down until your leg is straight again.  Lying on your back spread your legs as far apart as you can without causing discomfort.  Lying on your side, raise your upper leg and foot straight up from the floor as far as is comfortable. Slowly lower the leg and repeat.  Lying on your back, tighten up the muscle in the front of your thigh (quadriceps muscles). You can do this by keeping your leg straight and trying to raise your heel off the floor. This helps strengthen the largest muscle supporting your knee.  Lying on your back, tighten up the muscles of your buttocks both with the legs straight and with the knee bent at a comfortable angle while keeping your heel on the floor.   SKILLED REHAB INSTRUCTIONS: If the patient is transferred to a skilled rehab facility following release from the hospital, a list of the current medications will be sent to the facility for the patient to continue.  When discharged from the skilled rehab facility, please have the facility set up the patient's [place] prior to being released. Also, the skilled facility will be responsible for providing the patient with their medications at time of release from the facility to include their pain medication, the muscle relaxants, and their blood thinner medication. If the patient is still at the rehab facility at time of the two week follow up appointment, the skilled rehab facility will also need to assist the patient in arranging follow up appointment in our office and any transportation needs.  MAKE SURE YOU:  Understand these instructions.  Will watch your condition.  Will get help right away if you are not doing well or get worse.  Pick up  stool softner and laxative for home. Do not submerge incision under water. May shower. Continue to use ice for pain and swelling from surgery. Total Hip Protocol.  Take Xarelto for two and a half more weeks, then discontinue Xarelto. Once the patient has completed the blood thinner regimen, then take a Baby 81 mg Aspirin daily for four more weeks.  When discharged from the skilled rehab facility, please have the facility set up the patient's [place] prior to being released.  Also provide the patient with their medications at time of release from the facility to include their pain medication, the muscle relaxants, and their blood thinner medication.  If the patient is still at the rehab facility at time of follow up appointment, please also assist the patient in arranging follow up appointment in our office and any transportation needs.    Information on my medicine - XARELTO (Rivaroxaban)  This medication education was reviewed  with me or my healthcare representative as part of my discharge preparation.  The pharmacist that spoke with me during my hospital stay was:  Ysidro Evert So, pharmD  Why was Xarelto prescribed for you? Xarelto was prescribed for you to reduce the risk of a blood clot forming that can cause a stroke if you have a medical condition called atrial fibrillation (a type of irregular heartbeat).  What do you need to know about xarelto ? Take your Xarelto ONCE DAILY at the same time every day with your evening meal. If you have difficulty swallowing the tablet whole, you may crush it and mix in applesauce just prior to taking your dose.  Take Xarelto exactly as prescribed by your doctor and DO NOT stop taking Xarelto without talking to the doctor who prescribed the medication.  Stopping without other stroke prevention medication to take the place of Xarelto may increase your risk of developing a clot that causes a stroke.  Refill your prescription  before you run out.  After discharge, you should have regular check-up appointments with your healthcare provider that is prescribing your Xarelto.  In the future your dose may need to be changed if your kidney function or weight changes by a significant amount.  What do you do if you miss a dose? If you are taking Xarelto ONCE DAILY and you miss a dose, take it as soon as you remember on the same day then continue your regularly scheduled once daily regimen the next day. Do not take two doses of Xarelto at the same time or on the same day.   Important Safety Information A possible side effect of Xarelto is bleeding. You should call your healthcare provider right away if you experience any of the following:   Bleeding from an injury or your nose that does not stop.   Unusual colored urine (red or dark brown) or unusual colored stools (red or black).   Unusual bruising for unknown reasons.   A serious fall or if you hit your head (even if there is no bleeding).  Some medicines may interact with Xarelto and might increase your risk of bleeding while on Xarelto. To help avoid this, consult your healthcare provider or pharmacist prior to using any new prescription or non-prescription medications, including herbals, vitamins, non-steroidal anti-inflammatory drugs (NSAIDs) and supplements.  This website has more information on Xarelto: [email or web address]

## 2013-10-29 NOTE — Transfer of Care (Signed)
Immediate Anesthesia Transfer of Care Note  Patient: Tiffany Padilla  Procedure(s) Performed: Procedure(s): RIGHT TOTAL HIP ARTHROPLASTY ANTERIOR APPROACH (Right)  Patient Location: PACU  Anesthesia Type:General  Level of Consciousness: awake, alert  and oriented  Airway & Oxygen Therapy: Patient Spontanous Breathing and Patient connected to nasal cannula oxygen  Post-op Assessment: Report given to PACU RN, Post -op Vital signs reviewed and stable and Patient moving all extremities X 4  Post vital signs: Reviewed and stable  Complications: No apparent anesthesia complications

## 2013-10-29 NOTE — Interval H&P Note (Signed)
History and Physical Interval Note:  10/29/2013 9:03 AM  Tiffany Padilla  has presented today for surgery, with the diagnosis of Osteoarthritis of the Right Hip  The various methods of treatment have been discussed with the patient and family. After consideration of risks, benefits and other options for treatment, the patient has consented to  Procedure(s): RIGHT TOTAL HIP ARTHROPLASTY ANTERIOR APPROACH (Right) as a surgical intervention .  The patient's history has been reviewed, patient examined, no change in status, stable for surgery.  I have reviewed the patient's chart and labs.  Questions were answered to the patient's satisfaction.     [name]

## 2013-10-29 NOTE — Op Note (Signed)
OPERATIVE REPORT  PREOPERATIVE DIAGNOSIS: Osteoarthritis of the Right hip.   POSTOPERATIVE DIAGNOSIS: Osteoarthritis of the Right  hip.   PROCEDURE: Right total hip arthroplasty, anterior approach.   SURGEON: Gaynelle Arabian, MD   ASSISTANT: Arlee Muslim, PA-C  ANESTHESIA:  General  ESTIMATED BLOOD LOSS:-400 ml  DRAINS: Hemovac x1.   COMPLICATIONS: None   CONDITION: PACU - hemodynamically stable.   BRIEF CLINICAL NOTE: Tiffany Padilla is a 80 y.o. female who has advanced end-  stage arthritis of his Right  hip with progressively worsening pain and  dysfunction.The patient has failed nonoperative management and presents for  total hip arthroplasty.   PROCEDURE IN DETAIL: After successful administration of spinal  anesthetic, the traction boots for the [facility] bed were placed on both  feet and the patient was placed onto the [facility] bed, boots placed into the leg  holders. The Right hip was then isolated from the perineum with plastic  drapes and prepped and draped in the usual sterile fashion. ASIS and  greater trochanter were marked and a oblique incision was made, starting  at about 1 cm lateral and 2 cm distal to the ASIS and coursing towards  the anterior cortex of the femur. The skin was cut with a 10 blade  through subcutaneous tissue to the level of the fascia overlying the  tensor fascia lata muscle. The fascia was then incised in line with the  incision at the junction of the anterior third and posterior 2/3rd. The  muscle was teased off the fascia and then the interval between the TFL  and the rectus was developed. The Hohmann retractor was then placed at  the top of the femoral neck over the capsule. The vessels overlying the  capsule were cauterized and the fat on top of the capsule was removed.  A Hohmann retractor was then placed anterior underneath the rectus  femoris to give exposure to the entire anterior capsule. A T-shaped  capsulotomy was performed.  The edges were tagged and the femoral head  was identified.       Osteophytes are removed off the superior acetabulum.  The femoral neck was then cut in situ with an oscillating saw. Traction  was then applied to the left lower extremity utilizing the [facility]  traction. The femoral head was then removed. Retractors were placed  around the acetabulum and then circumferential removal of the labrum was  performed. Osteophytes were also removed. Reaming starts at 45 mm to  medialize and  Increased in 2 mm increments to 51 mm. We reamed in  approximately 40 degrees of abduction, 20 degrees anteversion. A 52 mm  pinnacle acetabular shell was then impacted in anatomic position under  fluoroscopic guidance with excellent purchase. We did not need to place  any additional dome screws. A 32 mm neutral + 4 marathon liner was then  placed into the acetabular shell.       The femoral lift was then placed along the lateral aspect of the femur  just distal to the vastus ridge. The leg was  externally rotated and capsule  was stripped off the inferior aspect of the femoral neck down to the  level of the lesser trochanter, this was done with electrocautery. The femur was lifted after this was performed. The  leg was then placed and extended in adducted position to essentially delivering the femur. We also removed the capsule superiorly and the  piriformis from the piriformis fossa  to gain excellent exposure of the  proximal femur. Rongeur was used to remove some cancellous bone to get  into the lateral portion of the proximal femur for placement of the  initial starter reamer. The starter broaches was placed  the starter broach  and was shown to go down the center of the canal. Broaching  with the  Corail system was then performed starting at size 8, coursing  Up to size 11. A size 11 had excellent torsional and rotational  and axial stability. The trial standard offset neck was then placed  with a 32 + 1  trial head. The hip was then reduced. We confirmed that  the stem was in the canal both on AP and lateral x-rays. It also has excellent sizing. The hip was reduced with outstanding stability through full extension, full external rotation,  and then flexion in adduction internal rotation. AP pelvis was taken  and the leg lengths were measured and found to be exactly equal. Hip  was then dislocated again and the femoral head and neck removed. The  femoral broach was removed. Size 11 Corail stem with a standard offset  neck was then impacted into the femur following native anteversion. Has  excellent purchase in the canal. Excellent torsional and rotational and  axial stability. It is confirmed to be in the canal on AP and lateral  fluoroscopic views. The 32 + 1 metal head was placed and the hip  reduced with outstanding stability. Again AP pelvis was taken and it  confirmed that the leg lengths were equal. The wound was then copiously  irrigated with saline solution and the capsule reattached and repaired  with Ethibond suture.  20 mL of Exparel mixed with 50 mL of saline then additional 20 ml of .25% Bupivicaine injected into the capsule and into the edge of the tensor fascia lata as well as subcutaneous tissue. The fascia overlying the tensor fascia lata was  then closed with a running #1 V-Loc. Subcu was closed with interrupted  2-0 Vicryl and subcuticular running 4-0 Monocryl. Incision was cleaned  and dried. Steri-Strips and a bulky sterile dressing applied. Hemovac  drain was hooked to suction and then he was awakened and transported to  recovery in stable condition.        Please note that a surgical assistant was a medical necessity for this procedure to perform it in a safe and expeditious manner. Assistant was necessary to provide appropriate retraction of vital neurovascular structures and to prevent femoral fracture and allow for anatomic placement of the prosthesis.  Gaynelle Arabian,  M.D.

## 2013-10-30 LAB — BASIC METABOLIC PANEL
BUN: 9 mg/dL (ref 6–23)
CALCIUM: 8.6 mg/dL (ref 8.4–10.5)
CHLORIDE: 102 meq/L (ref 96–112)
CO2: 25 meq/L (ref 19–32)
Creatinine, Ser: 0.56 mg/dL (ref 0.50–1.10)
GFR calc Af Amer: >90 mL/min (ref >90)
GFR calc non Af Amer: 88 mL/min — ABNORMAL LOW (ref >90)
Glucose, Bld: 128 mg/dL — ABNORMAL HIGH (ref 70–99)
Potassium: 4.5 mEq/L (ref 3.7–5.3)
SODIUM: 138 meq/L (ref 137–147)

## 2013-10-30 LAB — CBC
HCT: 28.9 % — ABNORMAL LOW (ref 36.0–46.0)
Hemoglobin: 9.7 g/dL — ABNORMAL LOW (ref 12.0–15.0)
MCH: 28.4 pg (ref 26.0–34.0)
MCHC: 33.6 g/dL (ref 30.0–36.0)
MCV: 84.8 fL (ref 78.0–100.0)
PLATELETS: 266 10*3/uL (ref 150–400)
RBC: 3.41 MIL/uL — AB (ref 3.87–5.11)
RDW: 15.4 % (ref 11.5–15.5)
WBC: 10.1 10*3/uL (ref 4.0–10.5)

## 2013-10-30 MED ORDER — WHITE PETROLATUM GEL
Status: AC
Start: 2013-10-30 — End: 2013-10-30
  Administered 2013-10-30: 06:00:00
  Filled 2013-10-30: qty 5

## 2013-10-30 NOTE — Progress Notes (Signed)
   Subjective: 1 Day Post-Op Procedure(s) (LRB): RIGHT TOTAL HIP ARTHROPLASTY ANTERIOR APPROACH (Right) Patient reports pain as mild.  Pain much better this AM. Had more considerable pain last night We will start therapy today.  Plan is to go Skilled nursing facility after hospital stay.  Objective: Vital signs in last 24 hours: Temp:  [97.3 F (36.3 C)-98.9 F (37.2 C)] 98.9 F (37.2 C) (02/28 0545) Pulse Rate:  [71-91] 85 (02/28 0545) Resp:  [8-18] 18 (02/28 0545) BP: (135-191)/(67-91) 135/67 mmHg (02/28 0545) SpO2:  [97 %-100 %] 97 % (02/28 0545)  Intake/Output from previous day:  Intake/Output Summary (Last 24 hours) at 10/30/13 0838 Last data filed at 10/30/13 0800  Gross per 24 hour  Intake   2450 ml  Output   4100 ml  Net  -1650 ml    Intake/Output this shift: Total I/O In: 360 [P.O.:360] Out: 400 [Urine:400]  Labs:  Recent Labs  10/30/13 0415  HGB 9.7*    Recent Labs  10/30/13 0415  WBC 10.1  RBC 3.41*  HCT 28.9*  PLT 266    Recent Labs  10/30/13 0415  NA 138  K 4.5  CL 102  CO2 25  BUN 9  CREATININE 0.56  GLUCOSE 128*  CALCIUM 8.6   No results found for this basename: LABPT, INR,  in the last 72 hours  EXAM General - Patient is Alert, Appropriate and Oriented Extremity - Neurologically intact Neurovascular intact No cellulitis present Compartment soft Dressing - dressing C/D/I Motor Function - intact, moving foot and toes well on exam.  Hemovac pulled without difficulty.  Past Medical History  Diagnosis Date  . Bursitis of hip, right 04/2010, 03/2011    s/p cortisone shot (Dr. Rhona Raider); and 03/2011 by Dr. Tomi Bamberger  . Hearing loss     wears hearing aides   . Gastric ulcer 10/2003    treated for H. Pylori, Dr. Carlean Purl  . Abnormal Pap smear of cervix 1990    "precancer" of cervix, s/p treatment  . Melanoma 2001    back  . BCC (basal cell carcinoma), face 02/2011    nose  . Right hip pain 08/24/2013    R OA hip injection  .  Depression     takes Prozac daily  . Anxiety     takes Valium daily as needed  . GERD (gastroesophageal reflux disease)     takes Omeprazole daily as needed  . Headache(784.0)     rarely  . Arthritis   . Joint pain   . Joint swelling   . Back pain     scoliosis  . Osteoarthritis   . Dry skin   . Itchy skin     only during the winter    Assessment/Plan: 1 Day Post-Op Procedure(s) (LRB): RIGHT TOTAL HIP ARTHROPLASTY ANTERIOR APPROACH (Right) Principal Problem:   OA (osteoarthritis) of hip   Advance diet Up with therapy D/C IV fluids Social worker for SNF  DVT Prophylaxis - Xarelto Weight Bearing As Tolerated right Leg Hemovac Pulled Begin Therapy   [name]

## 2013-10-30 NOTE — Evaluation (Signed)
Physical Therapy Evaluation Patient Details Name: Tiffany Padilla MRN: [id number] DOB: 04/08/1934 Today's Date: 10/30/2013 Time: 9702-6378 PT Time Calculation (min): 36 min  PT Assessment / Plan / Recommendation History of Present Illness  Pt is a 80 y/o female admitted s/p R THA direct anterior approach.   Clinical Impression  This patient presents with acute pain and decreased functional independence following the above mentioned procedure. At the time of PT eval, pt appeared to be pushing herself to achieve mobility. Patient lives alone and does not have adequate support to safely discharge home at this time. Recommend skilled nursing facility for post-acute therapy needs.     PT Assessment  Patient needs continued PT services    Follow Up Recommendations  SNF    Does the patient have the potential to tolerate intense rehabilitation      Barriers to Discharge Decreased caregiver support Pt lives alone    Equipment Recommendations  Other (comment) (TBD by next venue of care)    Recommendations for Other Services     Frequency 7X/week    Precautions / Restrictions Precautions Precautions: Fall Precaution Comments: Direct anterior - no precautions.  Restrictions Weight Bearing Restrictions: Yes RLE Weight Bearing: Weight bearing as tolerated   Pertinent Vitals/Pain Pt reports minimal pain throughout session. She states that she had pain medication prior to session beginning.       Mobility  Bed Mobility Overal bed mobility: Needs Assistance Bed Mobility: Supine to Sit Supine to sit: Min assist General bed mobility comments: VC's for sequencing and technique. Assist for movement and support of RLE to EOB.  Transfers Overall transfer level: Needs assistance Equipment used: Rolling walker (2 wheeled) Transfers: Sit to/from Stand Sit to Stand: Min guard General transfer comment: VC's for hand placement on seated surface for safety.  Ambulation/Gait Ambulation/Gait  assistance: Min guard Ambulation Distance (Feet): 175 Feet Assistive device: Rolling walker (2 wheeled) Gait Pattern/deviations: Step-to pattern;Step-through pattern;Decreased stride length Gait velocity: Decreased Gait velocity interpretation: Below normal speed for age/gender General Gait Details: VC's for sequencing and safety awareness with the RW. Encouraged step-through gait pattern and improved posture. Pt moving slowly and insisted on pushing herself for distance.     Exercises Total Joint Exercises Ankle Circles/Pumps: 10 reps Quad Sets: 10 reps Hip ABduction/ADduction: 10 reps   PT Diagnosis: Difficulty walking;Acute pain  PT Problem List: Decreased strength;Decreased range of motion;Decreased activity tolerance;Decreased balance;Decreased mobility;Decreased knowledge of use of DME;Decreased safety awareness;Decreased knowledge of precautions;Pain PT Treatment Interventions: DME instruction;Gait training;Stair training;Functional mobility training;Therapeutic activities;Therapeutic exercise;Neuromuscular re-education;Patient/family education     PT Goals(Current goals can be found in the care plan section) Acute Rehab PT Goals Patient Stated Goal: D/C to SNF for rehab PT Goal Formulation: With patient/family Time For Goal Achievement: 11/13/13 Potential to Achieve Goals: Good  Visit Information  Last PT Received On: 10/30/13 Assistance Needed: +1 History of Present Illness: Pt is a 80 y/o female admitted s/p R THA direct anterior approach.        Prior [place] expects to be discharged to:: Skilled nursing facility Living Arrangements: Alone Additional Comments: Wants [facility] Prior Function Level of Independence: Independent Communication Communication: No difficulties Dominant Hand: Right    Cognition  Cognition Arousal/Alertness: Awake/alert Behavior During Therapy: WFL for tasks assessed/performed Overall Cognitive  Status: Within Functional Limits for tasks assessed    Extremity/Trunk Assessment Upper Extremity Assessment Upper Extremity Assessment: Defer to OT evaluation Lower Extremity Assessment Lower Extremity Assessment: RLE deficits/detail RLE Deficits /  Details: Decreased strength and AROM consistent with THA RLE: Unable to fully assess due to pain Cervical / Trunk Assessment Cervical / Trunk Assessment: Kyphotic   Balance Balance Overall balance assessment: Needs assistance Sitting-balance support: Feet supported;Bilateral upper extremity supported Sitting balance-Leahy Scale: Fair Standing balance support: Bilateral upper extremity supported Standing balance-Leahy Scale: Poor  End of Session PT - End of Session Equipment Utilized During Treatment: Gait belt Activity Tolerance: Patient tolerated treatment well Patient left: in chair;with call bell/phone within reach;with family/visitor present Nurse Communication: Mobility status  GP     Jolyn Lent 10/30/2013, 4:33 PM  Jolyn Lent, [place], DPT [phone]

## 2013-10-31 LAB — BASIC METABOLIC PANEL
BUN: 14 mg/dL (ref 6–23)
CHLORIDE: 106 meq/L (ref 96–112)
CO2: 26 mEq/L (ref 19–32)
Calcium: 8.7 mg/dL (ref 8.4–10.5)
Creatinine, Ser: 0.53 mg/dL (ref 0.50–1.10)
GFR calc non Af Amer: 89 mL/min — ABNORMAL LOW (ref >90)
Glucose, Bld: 135 mg/dL — ABNORMAL HIGH (ref 70–99)
Potassium: 4.2 mEq/L (ref 3.7–5.3)
Sodium: 144 mEq/L (ref 137–147)

## 2013-10-31 LAB — CBC
HEMATOCRIT: 27.6 % — AB (ref 36.0–46.0)
Hemoglobin: 9.3 g/dL — ABNORMAL LOW (ref 12.0–15.0)
MCH: 29 pg (ref 26.0–34.0)
MCHC: 33.7 g/dL (ref 30.0–36.0)
MCV: 86 fL (ref 78.0–100.0)
PLATELETS: 257 10*3/uL (ref 150–400)
RBC: 3.21 MIL/uL — AB (ref 3.87–5.11)
RDW: 16 % — AB (ref 11.5–15.5)
WBC: 10.8 10*3/uL — AB (ref 4.0–10.5)

## 2013-10-31 NOTE — Progress Notes (Addendum)
Clinical Social Work Department CLINICAL SOCIAL WORK PLACEMENT NOTE 10/31/2013  Patient:  Tiffany Padilla, Tiffany Padilla  Account Number:  [id number] Admit date:  10/29/2013  Clinical Social Worker:  Blima Rich, Latanya Presser  Date/time:  10/31/2013 03:20 PM  Clinical Social Work is seeking post-discharge placement for this patient at the following level of care:   SKILLED NURSING   (*CSW will update this form in Epic as items are completed)   10/31/2013  Patient/family provided with [facility] Department of Clinical Social Work's list of facilities offering this level of care within the geographic area requested by the patient (or if unable, by the patient's family).  10/31/2013  Patient/family informed of their freedom to choose among providers that offer the needed level of care, that participate in Medicare, Medicaid or managed care program needed by the patient, have an available bed and are willing to accept the patient.  10/31/2013  Patient/family informed of [facility]' ownership interest in [facility], as well as of the fact that they are under no obligation to receive care at this facility.  PASARR submitted to EDS on 10/31/2013 PASARR number received from EDS on 10/31/2013  FL2 transmitted to all facilities in geographic area requested by pt/family on  10/31/2013 FL2 transmitted to all facilities within larger geographic area on   Patient informed that his/her managed care company has contracts with or will negotiate with  certain facilities, including the following:     Patient/family informed of bed offers received:  11/01/2013 Patient chooses bed at [facility] Physician recommends and patient chooses bed at    Patient to be transferred to  on  11/01/2013 Patient to be transferred to facility by  Private vehicle  The following physician request were entered in Epic:   Additional Comments:

## 2013-10-31 NOTE — Progress Notes (Signed)
Physical Therapy Treatment Patient Details Name: Tiffany Padilla MRN: [id number] DOB: [birth year] Today's Date: 10/31/2013 Time: 1410-1420 PT Time Calculation (min): 10 min  PT Assessment / Plan / Recommendation  History of Present Illness Pt is a 80 y/o female admitted s/p R THA direct anterior approach.    PT Comments   Pt reporting increased fatigue this pm and therefore only willing to do exercises, declined out of bed.  Follow Up Recommendations  SNF     Equipment Recommendations  Other (comment) (TBD by next venue)    Frequency 7X/week   Progress towards PT Goals Progress towards PT goals: Progressing toward goals  Plan Current plan remains appropriate    Precautions / Restrictions Precautions Precautions: Fall Precaution Comments: Direct anterior - no precautions.  Restrictions RLE Weight Bearing: Weight bearing as tolerated    Mobility       Exercises Total Joint Exercises Ankle Circles/Pumps: AROM;Both;10 reps;Supine Quad Sets: AROM;Strengthening;Both;10 reps;Supine Short Arc Quad: AROM;Strengthening;Right;10 reps;Supine Heel Slides: AAROM;Strengthening;Right;10 reps;Supine Hip ABduction/ADduction: AAROM;Strengthening;Right;10 reps;Supine     PT Goals (current goals can now be found in the care plan section) Acute Rehab PT Goals Patient Stated Goal: D/C to SNF for rehab PT Goal Formulation: With patient/family Time For Goal Achievement: 11/13/13 Potential to Achieve Goals: Good  Visit Information  Last PT Received On: 10/31/13 Assistance Needed: +1 History of Present Illness: Pt is a 80 y/o female admitted s/p R THA direct anterior approach.     Subjective Data  Patient Stated Goal: D/C to SNF for rehab   Cognition  Cognition Arousal/Alertness: Awake/alert Behavior During Therapy: [facility] for tasks assessed/performed Overall Cognitive Status: Within Functional Limits for tasks assessed    End of Session PT - End of Session Activity Tolerance: Patient  tolerated treatment well;Patient limited by fatigue Patient left: in bed;with call bell/phone within reach Nurse Communication: Mobility status   GP     Willow Ora 10/31/2013, 3:58 PM  Willow Ora, PTA Office- [phone]

## 2013-10-31 NOTE — Progress Notes (Signed)
Subjective: 2 Days Post-Op Procedure(s) (LRB): RIGHT TOTAL HIP ARTHROPLASTY ANTERIOR APPROACH (Right) Patient reports pain as mild.  Reports incisional pain, well controlled. Tolerating PT well. No CP, SOB, fever, chills. Voiding without difficulty. No other c/o this AM.  Objective: Vital signs in last 24 hours: Temp:  [97.8 F (36.6 C)-98.3 F (36.8 C)] 98.3 F (36.8 C) (03/01 0506) Pulse Rate:  [92-94] 94 (03/01 0506) Resp:  [16-18] 18 (03/01 0831) BP: (124-132)/(61-66) 132/61 mmHg (03/01 0506) SpO2:  [94 %-98 %] 98 % (03/01 0831)  Intake/Output from previous day: 02/28 0701 - 03/01 0700 In: 1320 [P.O.:1320] Out: 400 [Urine:400] Intake/Output this shift: Total I/O In: 320 [P.O.:320] Out: -    Recent Labs  10/30/13 0415 10/31/13 0504  HGB 9.7* 9.3*    Recent Labs  10/30/13 0415 10/31/13 0504  WBC 10.1 10.8*  RBC 3.41* 3.21*  HCT 28.9* 27.6*  PLT 266 257    Recent Labs  10/30/13 0415 10/31/13 0504  NA 138 144  K 4.5 4.2  CL 102 106  CO2 25 26  BUN 9 14  CREATININE 0.56 0.53  GLUCOSE 128* 135*  CALCIUM 8.6 8.7   No results found for this basename: LABPT, INR,  in the last 72 hours  Neurologically intact ABD soft Neurovascular intact Sensation intact distally Intact pulses distally Dorsiflexion/Plantar flexion intact Incision: dressing C/D/I and no drainage No cellulitis present Compartment soft no calf pain or sign of DVT  Assessment/Plan: 2 Days Post-Op Procedure(s) (LRB): RIGHT TOTAL HIP ARTHROPLASTY ANTERIOR APPROACH (Right) Advance diet Up with therapy D/C IV fluids Plan for discharge tomorrow to SNF Dressing change today Continue PT Plan D/C tomorrow  Lacie Draft M. 10/31/2013, 8:48 AM

## 2013-10-31 NOTE — Progress Notes (Signed)
Physical Therapy Treatment Patient Details Name: Tiffany Padilla MRN: [id number] DOB: [birth year] Today's Date: 10/31/2013 Time: 1324-4010 PT Time Calculation (min): 19 min  PT Assessment / Plan / Recommendation  History of Present Illness Pt is a 80 y/o female admitted s/p R THA direct anterior approach.    PT Comments   Pt making steady progress toward goals.  Follow Up Recommendations  SNF     Equipment Recommendations  Other (comment) (TBD by next venue )    Frequency 7X/week   Progress towards PT Goals Progress towards PT goals: Progressing toward goals  Plan Current plan remains appropriate    Precautions / Restrictions Precautions Precautions: Fall Precaution Comments: Direct anterior - no precautions.  Restrictions RLE Weight Bearing: Weight bearing as tolerated       Mobility  Bed Mobility General bed mobility comments: pt out of bed in recliner before and after session Transfers Overall transfer level: Needs assistance Equipment used: Rolling walker (2 wheeled) Transfers: Sit to/from Stand Sit to Stand: Min guard General transfer comment: cues for hand placement with standing only Ambulation/Gait Ambulation/Gait assistance: Min guard;Supervision Ambulation Distance (Feet): 80 Feet Assistive device: Rolling walker (2 wheeled) Gait Pattern/deviations: Step-through pattern;Decreased stride length;Decreased stance time - right;Decreased step length - left;Antalgic Gait velocity: Decreased Gait velocity interpretation: Below normal speed for age/gender General Gait Details: cues on walker position and safety with gait.    Exercises Total Joint Exercises Ankle Circles/Pumps: AROM;Both;10 reps;Seated Quad Sets: AROM;Strengthening;Both;10 reps;Seated Hip ABduction/ADduction: AROM;Strengthening;Right;10 reps;Standing Long Arc Quad: AROM;Strengthening;Right;10 reps;Seated Marching in Standing: AROM;Strengthening;Right;10 reps;Standing     PT Goals (current  goals can now be found in the care plan section) Acute Rehab PT Goals Patient Stated Goal: D/C to SNF for rehab PT Goal Formulation: With patient/family Time For Goal Achievement: 11/13/13 Potential to Achieve Goals: Good  Visit Information  Last PT Received On: 10/31/13 Assistance Needed: +1 History of Present Illness: Pt is a 80 y/o female admitted s/p R THA direct anterior approach.     Subjective Data  Patient Stated Goal: D/C to SNF for rehab   Cognition  Cognition Arousal/Alertness: Awake/alert Behavior During Therapy: [facility] for tasks assessed/performed Overall Cognitive Status: Within Functional Limits for tasks assessed    End of Session PT - End of Session Equipment Utilized During Treatment: Gait belt Activity Tolerance: Patient tolerated treatment well Patient left: in chair;with call bell/phone within reach;with family/visitor present Nurse Communication: Mobility status   GP     Willow Ora 10/31/2013, 11:54 AM  Willow Ora, PTA Office- [phone]

## 2013-10-31 NOTE — Progress Notes (Signed)
Clinical Social Work Department BRIEF PSYCHOSOCIAL ASSESSMENT 10/31/2013  Patient:  SOFYA, MOUSTAFA     Account Number:  [id number]     Admit date:  10/29/2013  Clinical Social Worker:  Hubert Azure  Date/Time:  10/31/2013 05:51 PM  Referred by:  Physician  Date Referred:  10/31/2013 Referred for  SNF Placement   Other Referral:   Interview type:  Patient Other interview type:    PSYCHOSOCIAL DATA Living Status:  ALONE Admitted from facility:   Level of care:   Primary support name:  Bethanie Dicker Primary support relationship to patient:  FRIEND Degree of support available:   Good.    CURRENT CONCERNS  Other Concerns:    SOCIAL WORK ASSESSMENT / PLAN Clinical Social Worker met with patient to discuss D/C plan. Patient reported she lives alone in [place], but has plenty of support from her friend (ex) Engineer, technical sales. Patient was in agreement with D/C plan and listed [facility] as her preference. Patient further listed [facility] as another placement option. CSW to continue to be available as needs arise.   Assessment/plan status:  Psychosocial Support/Ongoing Assessment of Needs Other assessment/ plan:   Information/referral to community resources:   CSW provided patient with list of SNF in the community.    PATIENT'S/FAMILY'S RESPONSE TO PLAN OF CARE: Patient presented as relieved and thanked CSW for assistance.

## 2013-10-31 NOTE — Progress Notes (Signed)
   CARE MANAGEMENT NOTE 10/31/2013  Patient:  Tiffany Padilla, Tiffany Padilla   Account Number:  [id number]  Date Initiated:  10/31/2013  Documentation initiated by:  [facility]  Subjective/Objective Assessment:   adm: RIGHT TOTAL HIP ARTHROPLASTY ANTERIOR APPROACH (Right)     Action/Plan:   SNF   Anticipated DC Date:  11/01/2013   Anticipated DC Plan:  SKILLED NURSING FACILITY  In-house referral  Clinical Social Worker      DC Planning Services  CM consult      Choice offered to / List presented to:             Status of service:  Completed, signed off Medicare Important Message given?   (If response is "NO", the following Medicare IM given date fields will be blank) Date Medicare IM given:   Date Additional Medicare IM given:    Discharge Disposition:  [name]  Per UR Regulation:    If discussed at Long Length of Stay Meetings, dates discussed:    Comments:  10/31/13 09:45 CM spoke with pt who states she will be going to SNF [facility]) tomorrow.  CM notified CSW of probable SNF and pending discharge tomorrow.  No other CM needs were communicated.  Tiffany Padilla, BSN, CM [phone].

## 2013-11-01 ENCOUNTER — Encounter (HOSPITAL_COMMUNITY): Payer: Self-pay | Admitting: Orthopedic Surgery

## 2013-11-01 LAB — CBC
HCT: 27.2 % — ABNORMAL LOW (ref 36.0–46.0)
Hemoglobin: 9 g/dL — ABNORMAL LOW (ref 12.0–15.0)
MCH: 28.5 pg (ref 26.0–34.0)
MCHC: 33.1 g/dL (ref 30.0–36.0)
MCV: 86.1 fL (ref 78.0–100.0)
Platelets: 269 10*3/uL (ref 150–400)
RBC: 3.16 MIL/uL — ABNORMAL LOW (ref 3.87–5.11)
RDW: 16.3 % — ABNORMAL HIGH (ref 11.5–15.5)
WBC: 9.1 10*3/uL (ref 4.0–10.5)

## 2013-11-01 MED ORDER — RIVAROXABAN 10 MG PO TABS: 10.0000 mg | ORAL_TABLET | Freq: Every day | ORAL | Status: AC

## 2013-11-01 MED ORDER — METHOCARBAMOL 500 MG PO TABS: 500.0000 mg | ORAL_TABLET | Freq: Four times a day (QID) | ORAL | Status: AC | PRN

## 2013-11-01 MED ORDER — OXYCODONE HCL 5 MG PO TABS: 5.0000 mg | ORAL_TABLET | ORAL | Status: DC | PRN

## 2013-11-01 NOTE — Progress Notes (Signed)
Clinical social worker assisted with patient discharge to skilled nursing facility, [facility].  CSW addressed all family questions and concerns. CSW copied chart and added all important documents. CSW also set up patient transportation with [facility]. Clinical Social Worker will sign off for now as social work intervention is no longer needed.   Jayveon Convey, MSW, LCSWA [phone] 

## 2013-11-01 NOTE — Progress Notes (Signed)
Physical Therapy Treatment Patient Details Name: Tiffany Padilla MRN: [id number] DOB: 11-Jun-1934 Today's Date: 11/01/2013 Time: 8469-6295 PT Time Calculation (min): 26 min  PT Assessment / Plan / Recommendation  History of Present Illness Pt is a 80 y/o female admitted s/p R THA direct anterior approach.    PT Comments   Pt progressing towards physical therapy goals. Approached by CSW regarding car transfer as pt wishing for private transport to facility at d/c. Discussed HEP and car transfer with pt and visitor, and pt had no further questions. Pt anticipates d/c this afternoon.     SNF     Does the patient have the potential to tolerate intense rehabilitation     Barriers to Discharge        Equipment Recommendations  Other (comment) (TBD by next venue of care)    Recommendations for Other Services    Frequency 7X/week   Progress towards PT Goals Progress towards PT goals: Progressing toward goals  Plan Current plan remains appropriate    Precautions / Restrictions Precautions Precautions: Fall Precaution Comments: Direct anterior - no precautions.  Restrictions Weight Bearing Restrictions: Yes RLE Weight Bearing: Weight bearing as tolerated   Pertinent Vitals/Pain Pt reports minimal pain throughout session.     Mobility  Bed Mobility General bed mobility comments: Pt received sitting up in recliner.  Transfers Overall transfer level: Needs assistance Equipment used: Rolling walker (2 wheeled) Transfers: Sit to/from Stand Sit to Stand: Min guard General transfer comment: VC's for hand placement on seated surface for safety.  Ambulation/Gait Ambulation/Gait assistance: Min guard Ambulation Distance (Feet): 100 Feet Assistive device: Rolling walker (2 wheeled) Gait Pattern/deviations: Step-to pattern;Step-through pattern;Decreased stride length Gait velocity: Decreased Gait velocity interpretation: Below normal speed for age/gender General Gait Details: VC's for  sequencing with the RW, and encouraged step-through gait pattern. Choppy walker movement with decreased weight bearing through the operative leg.     Exercises Total Joint Exercises Ankle Circles/Pumps: 15 reps Towel Squeeze: 15 reps Heel Slides: 15 reps Hip ABduction/ADduction: 15 reps   PT Diagnosis:    PT Problem List:   PT Treatment Interventions:     PT Goals (current goals can now be found in the care plan section) Acute Rehab PT Goals Patient Stated Goal: D/C to SNF for rehab PT Goal Formulation: With patient/family Time For Goal Achievement: 11/13/13 Potential to Achieve Goals: Good  Visit Information  Last PT Received On: 11/01/13 Assistance Needed: +1 History of Present Illness: Pt is a 80 y/o female admitted s/p R THA direct anterior approach.     Subjective Data  Subjective: "I need to know how to get in the car." Patient Stated Goal: D/C to SNF for rehab   Cognition  Cognition Arousal/Alertness: Awake/alert Behavior During Therapy: [facility] for tasks assessed/performed Overall Cognitive Status: Within Functional Limits for tasks assessed    Balance  Balance Overall balance assessment: Needs assistance Sitting-balance support: Feet supported;Bilateral upper extremity supported Sitting balance-Leahy Scale: Fair Standing balance support: Bilateral upper extremity supported Standing balance-Leahy Scale: Fair  End of Session PT - End of Session Equipment Utilized During Treatment: Gait belt Activity Tolerance: Patient tolerated treatment well;Patient limited by fatigue Patient left: in bed;with call bell/phone within reach Nurse Communication: Mobility status   GP     Jolyn Lent 11/01/2013, 2:31 PM  Jolyn Lent, [place], DPT [phone]

## 2013-11-01 NOTE — Discharge Summary (Signed)
Physician Discharge Summary   Patient ID: Tiffany Padilla MRN: [id number] DOB/AGE: 1934/03/09 80 y.o.  Admit date: 10/29/2013 Discharge date: 11-01-2013  Primary Diagnosis:  Osteoarthritis of the Right hip.   Admission Diagnoses:  Past Medical History  Diagnosis Date  . Bursitis of hip, right 04/2010, 03/2011    s/p cortisone shot (Dr. Rhona Raider); and 03/2011 by Dr. Tomi Bamberger  . Hearing loss     wears hearing aides   . Gastric ulcer 10/2003    treated for H. Pylori, Dr. Carlean Purl  . Abnormal Pap smear of cervix 1990    "precancer" of cervix, s/p treatment  . Melanoma 2001    back  . BCC (basal cell carcinoma), face 02/2011    nose  . Right hip pain 08/24/2013    R OA hip injection  . Depression     takes Prozac daily  . Anxiety     takes Valium daily as needed  . GERD (gastroesophageal reflux disease)     takes Omeprazole daily as needed  . Headache(784.0)     rarely  . Arthritis   . Joint pain   . Joint swelling   . Back pain     scoliosis  . Osteoarthritis   . Dry skin   . Itchy skin     only during the winter   Discharge Diagnoses:   Principal Problem:   OA (osteoarthritis) of hip  Estimated body mass index is 23.30 kg/(m^2) as calculated from the following:   Height as of 10/26/13: 5' 5" (1.651 m).   Weight as of 07/05/13: 63.504 kg (140 lb).  Procedure(s) (LRB): RIGHT TOTAL HIP ARTHROPLASTY ANTERIOR APPROACH (Right)   Consults: None  HPI: Tiffany Padilla is a 80 y.o. female who has advanced end-  stage arthritis of his Right hip with progressively worsening pain and  dysfunction.The patient has failed nonoperative management and presents for  total hip arthroplasty.   Laboratory Data: Admission on 10/29/2013  Component Date Value Ref Range Status  . WBC 10/30/2013 10.1  4.0 - 10.5 K/uL Final  . RBC 10/30/2013 3.41* 3.87 - 5.11 MIL/uL Final  . Hemoglobin 10/30/2013 9.7* 12.0 - 15.0 g/dL Final  . HCT 10/30/2013 28.9* 36.0 - 46.0 % Final  . MCV 10/30/2013 84.8   78.0 - 100.0 fL Final  . MCH 10/30/2013 28.4  26.0 - 34.0 pg Final  . MCHC 10/30/2013 33.6  30.0 - 36.0 g/dL Final  . RDW 10/30/2013 15.4  11.5 - 15.5 % Final  . Platelets 10/30/2013 266  150 - 400 K/uL Final  . Sodium 10/30/2013 138  137 - 147 mEq/L Final  . Potassium 10/30/2013 4.5  3.7 - 5.3 mEq/L Final  . Chloride 10/30/2013 102  96 - 112 mEq/L Final  . CO2 10/30/2013 25  19 - 32 mEq/L Final  . Glucose, Bld 10/30/2013 128* 70 - 99 mg/dL Final  . BUN 10/30/2013 9  6 - 23 mg/dL Final  . Creatinine, Ser 10/30/2013 0.56  0.50 - 1.10 mg/dL Final  . Calcium 10/30/2013 8.6  8.4 - 10.5 mg/dL Final  . GFR calc non Af Amer 10/30/2013 88* >90 mL/min Final  . GFR calc Af Amer 10/30/2013 >90  >90 mL/min Final   Comment: (NOTE)                          The eGFR has been calculated using the CKD EPI equation.  This calculation has not been validated in all clinical situations.                          eGFR's persistently <90 mL/min signify possible Chronic Kidney                          Disease.  . WBC 10/31/2013 10.8* 4.0 - 10.5 K/uL Final  . RBC 10/31/2013 3.21* 3.87 - 5.11 MIL/uL Final  . Hemoglobin 10/31/2013 9.3* 12.0 - 15.0 g/dL Final  . HCT 10/31/2013 27.6* 36.0 - 46.0 % Final  . MCV 10/31/2013 86.0  78.0 - 100.0 fL Final  . MCH 10/31/2013 29.0  26.0 - 34.0 pg Final  . MCHC 10/31/2013 33.7  30.0 - 36.0 g/dL Final  . RDW 10/31/2013 16.0* 11.5 - 15.5 % Final  . Platelets 10/31/2013 257  150 - 400 K/uL Final  . Sodium 10/31/2013 144  137 - 147 mEq/L Final  . Potassium 10/31/2013 4.2  3.7 - 5.3 mEq/L Final  . Chloride 10/31/2013 106  96 - 112 mEq/L Final  . CO2 10/31/2013 26  19 - 32 mEq/L Final  . Glucose, Bld 10/31/2013 135* 70 - 99 mg/dL Final  . BUN 10/31/2013 14  6 - 23 mg/dL Final  . Creatinine, Ser 10/31/2013 0.53  0.50 - 1.10 mg/dL Final  . Calcium 10/31/2013 8.7  8.4 - 10.5 mg/dL Final  . GFR calc non Af Amer 10/31/2013 89* >90 mL/min Final  . GFR calc  Af Amer 10/31/2013 >90  >90 mL/min Final   Comment: (NOTE)                          The eGFR has been calculated using the CKD EPI equation.                          This calculation has not been validated in all clinical situations.                          eGFR's persistently <90 mL/min signify possible Chronic Kidney                          Disease.  Hospital Outpatient Visit on 10/26/2013  Component Date Value Ref Range Status  . MRSA, PCR 10/26/2013 NEGATIVE  NEGATIVE Final  . Staphylococcus aureus 10/26/2013 POSITIVE* NEGATIVE Final   Comment:                                 The Xpert SA Assay (FDA                          approved for NASAL specimens                          in patients over 46 years of age),                          is one component of                          a comprehensive surveillance  program.  Test performance has                          been validated by [facility] for patients greater                          than or equal to 58 year old.                          It is not intended                          to diagnose infection nor to                          guide or monitor treatment.  Marland Kitchen aPTT 10/26/2013 27  24 - 37 seconds Final  . WBC 10/26/2013 7.0  4.0 - 10.5 K/uL Final  . RBC 10/26/2013 4.44  3.87 - 5.11 MIL/uL Final  . Hemoglobin 10/26/2013 12.6  12.0 - 15.0 g/dL Final  . HCT 10/26/2013 38.3  36.0 - 46.0 % Final  . MCV 10/26/2013 86.3  78.0 - 100.0 fL Final  . MCH 10/26/2013 28.4  26.0 - 34.0 pg Final  . MCHC 10/26/2013 32.9  30.0 - 36.0 g/dL Final  . RDW 10/26/2013 15.9* 11.5 - 15.5 % Final  . Platelets 10/26/2013 317  150 - 400 K/uL Final  . Sodium 10/26/2013 141  137 - 147 mEq/L Final  . Potassium 10/26/2013 4.4  3.7 - 5.3 mEq/L Final  . Chloride 10/26/2013 100  96 - 112 mEq/L Final  . CO2 10/26/2013 28  19 - 32 mEq/L Final  . Glucose, Bld 10/26/2013 93  70 - 99 mg/dL Final  . BUN  10/26/2013 15  6 - 23 mg/dL Final  . Creatinine, Ser 10/26/2013 0.58  0.50 - 1.10 mg/dL Final  . Calcium 10/26/2013 9.2  8.4 - 10.5 mg/dL Final  . Total Protein 10/26/2013 7.8  6.0 - 8.3 g/dL Final  . Albumin 10/26/2013 3.9  3.5 - 5.2 g/dL Final  . AST 10/26/2013 33  0 - 37 U/L Final  . ALT 10/26/2013 27  0 - 35 U/L Final  . Alkaline Phosphatase 10/26/2013 66  39 - 117 U/L Final  . Total Bilirubin 10/26/2013 0.4  0.3 - 1.2 mg/dL Final  . GFR calc non Af Amer 10/26/2013 87* >90 mL/min Final  . GFR calc Af Amer 10/26/2013 >90  >90 mL/min Final   Comment: (NOTE)                          The eGFR has been calculated using the CKD EPI equation.                          This calculation has not been validated in all clinical situations.                          eGFR's persistently <90 mL/min signify possible Chronic Kidney  Disease.  Marland Kitchen Prothrombin Time 10/26/2013 12.0  11.6 - 15.2 seconds Final  . INR 10/26/2013 0.90  0.00 - 1.49 Final  . ABO/RH(D) 10/26/2013 A NEG   Final  . Antibody Screen 10/26/2013 NEG   Final  . Sample Expiration 10/26/2013 11/09/2013   Final  . Color, Urine 10/26/2013 YELLOW  YELLOW Final  . APPearance 10/26/2013 CLEAR  CLEAR Final  . Specific Gravity, Urine 10/26/2013 1.011  1.005 - 1.030 Final  . pH 10/26/2013 7.0  5.0 - 8.0 Final  . Glucose, UA 10/26/2013 NEGATIVE  NEGATIVE mg/dL Final  . Hgb urine dipstick 10/26/2013 NEGATIVE  NEGATIVE Final  . Bilirubin Urine 10/26/2013 NEGATIVE  NEGATIVE Final  . Ketones, ur 10/26/2013 NEGATIVE  NEGATIVE mg/dL Final  . Protein, ur 16/54/6124 NEGATIVE  NEGATIVE mg/dL Final  . Urobilinogen, UA 10/26/2013 0.2  0.0 - 1.0 mg/dL Final  . Nitrite 32/75/5623 NEGATIVE  NEGATIVE Final  . Leukocytes, UA 10/26/2013 MODERATE* NEGATIVE Final  . Squamous Epithelial / LPF 10/26/2013 RARE  RARE Final  . WBC, UA 10/26/2013 7-10  <3 WBC/hpf Final  . Bacteria, UA 10/26/2013 MANY* RARE Final  . ABO/RH(D) 10/26/2013 A NEG    Final     X-Rays:Dg Hip Complete Right  10/26/2013   CLINICAL DATA:  OA right hip  EXAM: RIGHT HIP - COMPLETE 2+ VIEW  FINDINGS: Within the right hip there is joint space narrowing. , subchondral sclerosis, areas of hypertrophic bone formation, and subchondral cyst formation. The bones are osteopenic. There is no evidence of acute fracture nor dislocation. Degenerative disc disease changes appreciated within the lower lumbar spine. There is joint space narrowing and subchondral sclerosis within the left hip.  IMPRESSION: Severe osteoarthritic changes in the right hip without evidence of acute osseous abnormalities. Osteoarthritic changes appreciated within the left hip.   Electronically Signed   By: Salome Holmes M.D.   On: 10/26/2013 14:42   Dg Hip Operative Right  10/29/2013   CLINICAL DATA:  Total right hip arthroplasty.  EXAM: DG OPERATIVE RIGHT HIP  TECHNIQUE: A single spot fluoroscopic AP image of the right hip is submitted.  COMPARISON:  10/26/2013  FINDINGS: The acetabular and femoral components are well seated. No complicating features are demonstrated.  IMPRESSION: Well seated components of a total right hip arthroplasty without complicating features.   Electronically Signed   By: Loralie Champagne M.D.   On: 10/29/2013 11:54   Dg Pelvis Portable  10/29/2013   CLINICAL DATA:  Postop right total hip arthroplasty.  EXAM: PORTABLE PELVIS 1-2 VIEWS  COMPARISON:  DG HIP OPERATIVE*R* dated 10/29/2013; DG HIP COMPLETE*R* dated 10/26/2013  FINDINGS: Sequelae of right total hip arthroplasty are identified. The femoral and acetabular components appear well seated on this single AP image. There is overlying soft tissue emphysema. A surgical drain remains in place. Left hip joint space narrowing is noted.  IMPRESSION: Sequelae of right total hip arthroplasty without radiographic evidence of complication.   Electronically Signed   By: Sebastian Ache   On: 10/29/2013 13:30    EKG:No orders found for this or any  previous visit.   Hospital Course: Patient was admitted to [facility] and taken to the OR and underwent the above state procedure without complications.  Patient tolerated the procedure well and was later transferred to the recovery room and then to the orthopaedic floor for postoperative care.  They were given PO and IV analgesics for pain control following their surgery.  They were given 24 hours of  postoperative antibiotics of  Anti-infectives   Start     Dose/Rate Route Frequency Ordered Stop   10/29/13 1700  ceFAZolin (ANCEF) IVPB 1 g/50 mL premix     1 g 100 mL/hr over 30 Minutes Intravenous Every 6 hours 10/29/13 1456 10/30/13 0025   10/29/13 0600  ceFAZolin (ANCEF) IVPB 2 g/50 mL premix     2 g 100 mL/hr over 30 Minutes Intravenous On call to O.R. 10/28/13 1357 10/29/13 1036     and started on DVT prophylaxis in the form of Xarelto.   PT and OT were ordered for total hip protocol.  The patient was allowed to be WBAT with therapy. Discharge planning was consulted to help with postop disposition and equipment needs.  Patient had a tough night on the evening of surgery but was doing better the next day.  They started to get up OOB with therapy on day one.  Hemovac drain was pulled without difficulty.  Continued to work with therapy into day two.  Dressing was changed on day two and the incision was healing well.  By day three, the patient had progressed with therapy and meeting their goals.  Incision was healing well.  Patient was seen in rounds by Dr. Wynelle Link and was ready to go home to the SNF of choice.  Discharge to SNF  Diet - Regular diet  Follow up - in 2 weeks  Activity - WBAT  Disposition - Skilled nursing facility - Waiting on final offer and bed choice.  Condition Upon Discharge - Good  D/C Meds - See DC Summary  DVT Prophylaxis - Xarelto     Medication List    STOP taking these medications       calcium-vitamin D 500-200 MG-UNIT per tablet      HYDROcodone-acetaminophen 5-325 MG per tablet  Commonly known as:  NORCO/VICODIN      TAKE these medications       acetaminophen 500 MG tablet  Commonly known as:  TYLENOL  Take 1,000 mg by mouth as needed for mild pain or fever.     diazepam 10 MG tablet  Commonly known as:  VALIUM  Take 0.5 tablets (5 mg total) by mouth every 8 (eight) hours as needed for anxiety.     FLUoxetine 40 MG capsule  Commonly known as:  PROZAC  Take 1 capsule (40 mg total) by mouth daily.     methocarbamol 500 MG tablet  Commonly known as:  ROBAXIN  Take 1 tablet (500 mg total) by mouth every 6 (six) hours as needed for muscle spasms.     multivitamin with minerals tablet  Take 1 tablet by mouth daily.     omeprazole 20 MG capsule  Commonly known as:  PRILOSEC  Take 1 capsule by mouth  daily     oxyCODONE 5 MG immediate release tablet  Commonly known as:  Oxy IR/ROXICODONE  Take 1-2 tablets (5-10 mg total) by mouth every 3 (three) hours as needed for breakthrough pain.     rivaroxaban 10 MG Tabs tablet  Commonly known as:  XARELTO  Take 1 tablet (10 mg total) by mouth daily with breakfast.           Follow-up Information   Follow up with [name], MD. Schedule an appointment as soon as possible for a visit on 11/12/2013. (For check up. Call [phone] tomorrow to make the appointment)    Specialty:  Orthopedic Surgery   Contact information:   [place]  [address]  [address] [phone]       Signed: Mickel Crow 11/01/2013, 6:40 AM

## 2013-11-01 NOTE — Progress Notes (Signed)
Subjective: 3 Days Post-Op Procedure(s) (LRB): RIGHT TOTAL HIP ARTHROPLASTY ANTERIOR APPROACH (Right) Patient reports pain as mild.   Patient seen in rounds with Dr. Wynelle Link. Patient is well, and has had no acute complaints or problems Patient is ready to go to the SNF if bed available today.  Objective: Vital signs in last 24 hours: Temp:  [97.7 F (36.5 C)-98 F (36.7 C)] 97.7 F (36.5 C) (03/02 0406) Pulse Rate:  [80-90] 82 (03/02 0406) Resp:  [16-18] 16 (03/02 0406) BP: (112-143)/(53-70) 143/62 mmHg (03/02 0406) SpO2:  [94 %-98 %] 96 % (03/02 0406)  Intake/Output from previous day:  Intake/Output Summary (Last 24 hours) at 11/01/13 4098 Last data filed at 10/31/13 2300  Gross per 24 hour  Intake   1000 ml  Output      0 ml  Net   1000 ml    Intake/Output this shift: Total I/O In: 200 [P.O.:200] Out: -   Labs:  Recent Labs  10/30/13 0415 10/31/13 0504  HGB 9.7* 9.3*    Recent Labs  10/30/13 0415 10/31/13 0504  WBC 10.1 10.8*  RBC 3.41* 3.21*  HCT 28.9* 27.6*  PLT 266 257    Recent Labs  10/30/13 0415 10/31/13 0504  NA 138 144  K 4.5 4.2  CL 102 106  CO2 25 26  BUN 9 14  CREATININE 0.56 0.53  GLUCOSE 128* 135*  CALCIUM 8.6 8.7   No results found for this basename: LABPT, INR,  in the last 72 hours  EXAM: General - Patient is Alert, Appropriate and Oriented Extremity - Neurovascular intact Sensation intact distally Incision - clean, dry, no drainage Motor Function - intact, moving foot and toes well on exam.   Assessment/Plan: 3 Days Post-Op Procedure(s) (LRB): RIGHT TOTAL HIP ARTHROPLASTY ANTERIOR APPROACH (Right) Procedure(s) (LRB): RIGHT TOTAL HIP ARTHROPLASTY ANTERIOR APPROACH (Right) Past Medical History  Diagnosis Date  . Bursitis of hip, right 04/2010, 03/2011    s/p cortisone shot (Dr. Rhona Raider); and 03/2011 by Dr. Tomi Bamberger  . Hearing loss     wears hearing aides   . Gastric ulcer 10/2003    treated for H. Pylori, Dr. Carlean Purl   . Abnormal Pap smear of cervix 1990    "precancer" of cervix, s/p treatment  . Melanoma 2001    back  . BCC (basal cell carcinoma), face 02/2011    nose  . Right hip pain 08/24/2013    R OA hip injection  . Depression     takes Prozac daily  . Anxiety     takes Valium daily as needed  . GERD (gastroesophageal reflux disease)     takes Omeprazole daily as needed  . Headache(784.0)     rarely  . Arthritis   . Joint pain   . Joint swelling   . Back pain     scoliosis  . Osteoarthritis   . Dry skin   . Itchy skin     only during the winter   Principal Problem:   OA (osteoarthritis) of hip  Estimated body mass index is 23.30 kg/(m^2) as calculated from the following:   Height as of 10/26/13: 5\' 5"  (1.651 m).   Weight as of 07/05/13: 63.504 kg (140 lb). Up with therapy Discharge to SNF Diet - Regular diet Follow up - in 2 weeks Activity - WBAT Disposition - Skilled nursing facility Condition Upon Discharge - Good D/C Meds - See DC Summary DVT Prophylaxis - Xarelto  Tiffany Padilla 11/01/2013, 6:37 AM

## 2013-11-01 NOTE — Progress Notes (Signed)
Pt complained of indigestion with some epigastric discomfort during the night. Vital signs were stable. Pt burping a lot and was able to relieve her discomfort after drinking a coke.

## 2013-11-01 NOTE — Progress Notes (Signed)
OT Cancellation Note  Patient Details Name: Tiffany Padilla MRN: [id number] DOB: [birth year]   Cancelled Treatment:    Reason Eval/Treat Not Completed: Other (comment) Pt is [employer] and current D/C plan is SNF with pt D/C'ing today. No apparent immediate acute care OT needs, therefore will defer OT to SNF. If OT eval is needed please call Acute Rehab Dept. at [phone] or text page OT at [phone].   Almon Register [phone] 11/01/2013, 1:58 PM

## 2013-11-02 ENCOUNTER — Non-Acute Institutional Stay (SKILLED_NURSING_FACILITY): Payer: Medicare Other | Admitting: Adult Health

## 2013-11-02 DIAGNOSIS — F411 Generalized anxiety disorder: Secondary | ICD-10-CM

## 2013-11-02 DIAGNOSIS — M169 Osteoarthritis of hip, unspecified: Secondary | ICD-10-CM

## 2013-11-02 DIAGNOSIS — K219 Gastro-esophageal reflux disease without esophagitis: Secondary | ICD-10-CM

## 2013-11-02 DIAGNOSIS — M1610 Unilateral primary osteoarthritis, unspecified hip: Secondary | ICD-10-CM

## 2013-11-02 DIAGNOSIS — F329 Major depressive disorder, single episode, unspecified: Secondary | ICD-10-CM

## 2013-11-02 DIAGNOSIS — K5900 Constipation, unspecified: Secondary | ICD-10-CM

## 2013-11-02 DIAGNOSIS — F3289 Other specified depressive episodes: Secondary | ICD-10-CM

## 2013-11-02 DIAGNOSIS — F419 Anxiety disorder, unspecified: Secondary | ICD-10-CM

## 2013-11-03 ENCOUNTER — Encounter: Payer: Self-pay | Admitting: *Deleted

## 2013-11-04 ENCOUNTER — Other Ambulatory Visit: Payer: Self-pay | Admitting: *Deleted

## 2013-11-04 MED ORDER — OXYCODONE HCL 5 MG PO TABS: ORAL_TABLET | ORAL | Status: DC

## 2013-11-04 NOTE — Telephone Encounter (Signed)
[facility] 

## 2013-11-06 ENCOUNTER — Encounter: Payer: Self-pay | Admitting: Internal Medicine

## 2013-11-06 ENCOUNTER — Non-Acute Institutional Stay (SKILLED_NURSING_FACILITY): Payer: Medicare Other | Admitting: Internal Medicine

## 2013-11-06 DIAGNOSIS — F3289 Other specified depressive episodes: Secondary | ICD-10-CM

## 2013-11-06 DIAGNOSIS — Z96649 Presence of unspecified artificial hip joint: Secondary | ICD-10-CM

## 2013-11-06 DIAGNOSIS — D62 Acute posthemorrhagic anemia: Secondary | ICD-10-CM

## 2013-11-06 DIAGNOSIS — F329 Major depressive disorder, single episode, unspecified: Secondary | ICD-10-CM

## 2013-11-06 DIAGNOSIS — K219 Gastro-esophageal reflux disease without esophagitis: Secondary | ICD-10-CM

## 2013-11-06 DIAGNOSIS — F419 Anxiety disorder, unspecified: Secondary | ICD-10-CM

## 2013-11-06 DIAGNOSIS — F411 Generalized anxiety disorder: Secondary | ICD-10-CM

## 2013-11-06 NOTE — Assessment & Plan Note (Signed)
Continue prozac 40mg daily ?

## 2013-11-06 NOTE — Assessment & Plan Note (Signed)
Continue valium prn

## 2013-11-06 NOTE — Assessment & Plan Note (Addendum)
And s/p gastric ulcer 2005; Continue omeprazole 20 mg daily

## 2013-11-06 NOTE — Assessment & Plan Note (Signed)
For end stage OA;pt on pain meds, robaxin and xarelto as prophylaxis for 2 1/2 weeks post d/c on 3/2 then 81 mg ASA for 4 weeks after that;admitted to SNF for OT/PT
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
## 2013-11-07 NOTE — Assessment & Plan Note (Signed)
Pre -op Hb 12.6, post-op 9.0; will monitor

## 2013-11-08 ENCOUNTER — Other Ambulatory Visit: Payer: Self-pay | Admitting: *Deleted

## 2013-11-08 MED ORDER — OXYCODONE HCL 5 MG PO TABS: ORAL_TABLET | ORAL | Status: AC

## 2013-11-08 NOTE — Telephone Encounter (Signed)
[facility] 

## 2013-11-10 ENCOUNTER — Non-Acute Institutional Stay (SKILLED_NURSING_FACILITY): Payer: Medicare Other | Admitting: Adult Health

## 2013-11-10 DIAGNOSIS — M169 Osteoarthritis of hip, unspecified: Secondary | ICD-10-CM

## 2013-11-10 DIAGNOSIS — F411 Generalized anxiety disorder: Secondary | ICD-10-CM

## 2013-11-10 DIAGNOSIS — F419 Anxiety disorder, unspecified: Secondary | ICD-10-CM

## 2013-11-10 DIAGNOSIS — F329 Major depressive disorder, single episode, unspecified: Secondary | ICD-10-CM

## 2013-11-10 DIAGNOSIS — F3289 Other specified depressive episodes: Secondary | ICD-10-CM

## 2013-11-10 DIAGNOSIS — M1610 Unilateral primary osteoarthritis, unspecified hip: Secondary | ICD-10-CM

## 2013-11-10 DIAGNOSIS — Z96649 Presence of unspecified artificial hip joint: Secondary | ICD-10-CM

## 2013-11-10 DIAGNOSIS — K5900 Constipation, unspecified: Secondary | ICD-10-CM

## 2013-11-10 DIAGNOSIS — K219 Gastro-esophageal reflux disease without esophagitis: Secondary | ICD-10-CM

## 2013-11-11 ENCOUNTER — Encounter: Payer: Self-pay | Admitting: Adult Health

## 2013-11-11 DIAGNOSIS — K5900 Constipation, unspecified: Secondary | ICD-10-CM | POA: Insufficient documentation

## 2013-11-11 NOTE — Progress Notes (Signed)
Patient ID: Tiffany Padilla, female   DOB: 1933/12/11, 80 y.o.   MRN: [id number]                 PROGRESS NOTE  DATE: 11/10/13  FACILITY: Nursing Home Location: [facility]  LEVEL OF CARE: SNF (31)  Acute Visit  CHIEF COMPLAINT:  Discharge Notes  HISTORY OF PRESENT ILLNESS: This is a 80 year old female who is for discharge home with Home health PT and OT. DME: [facility] [employer]. She has been admitted to [facility] on 11/01/13 from [facility] with Osteoarthritis S/P Right total hip arthroplasty.Patient was admitted to this facility for short-term rehabilitation after Tiffany patient's recent hospitalization.  Patient has completed SNF rehabilitation and therapy has cleared Tiffany patient for discharge.    REASSESSMENT OF ONGOING PROBLEM(S):  CONSTIPATION: Tiffany constipation remains stable. No complications from Tiffany medications presently being used. Patient denies ongoing constipation, abdominal pain, nausea or vomiting.  GERD: pt's GERD is stable.  Denies ongoing heartburn, abd. Pain, nausea or vomiting.  Currently on a PPI & tolerates it without any adverse reactions.  DEPRESSION: Tiffany depression remains stable. Patient denies ongoing feelings of sadness, insomnia, anedhonia or lack of appetite. No complications reported from Tiffany medications currently being used. Staff do not report behavioral problems.  PAST MEDICAL HISTORY : Reviewed.  No changes.  CURRENT MEDICATIONS: Reviewed per Tiffany Padilla  REVIEW OF SYSTEMS:  GENERAL: no change in appetite, no fatigue, no weight changes, no fever, chills or weakness RESPIRATORY: no cough, SOB, DOE, wheezing, hemoptysis CARDIAC: no chest pain, edema or palpitations GI: no abdominal pain, diarrhea, heart burn, nausea, vomiting, or constipation  PHYSICAL EXAMINATION  GENERAL: no acute distress, normal body habitus NECK: supple, trachea midline, no neck masses, no thyroid tenderness, no thyromegaly LYMPHATICS: no LAN in Tiffany neck, no  supraclavicular LAN RESPIRATORY: breathing is even & unlabored, BS CTAB CARDIAC: RRR, no murmur,no extra heart sounds, no edema GI: abdomen soft, normal BS, no masses, no tenderness, no hepatomegaly, no splenomegaly EXTREMITIES: able to move all four extremities PSYCHIATRIC: Tiffany patient is alert & oriented to person, affect & behavior appropriate  LABS/RADIOLOGY: Labs reviewed: Basic Metabolic Panel:  Recent Labs  06/22/13 0837 10/26/13 1350 10/30/13 0415 10/31/13 0504  NA 141 141 138 144  K 4.7 4.4 4.5 4.2  CL 105 100 102 106  CO2 27 28 25 26   GLUCOSE 92 93 128* 135*  BUN 16 15 9 14   CREATININE 0.75 0.58 0.56 0.53  CALCIUM 9.3 9.2 8.6 8.7  MG 2.0  --   --   --    Liver Function Tests:  Recent Labs  06/22/13 0837 10/26/13 1350  AST 18 33  ALT 11 27  ALKPHOS 48 66  BILITOT 0.7 0.4  PROT 6.4 7.8  ALBUMIN 3.9 3.9    CBC:  Recent Labs  06/22/13 0837  10/30/13 0415 10/31/13 0504 11/01/13 0634  WBC 4.3  < > 10.1 10.8* 9.1  NEUTROABS 2.6  --   --   --   --   HGB 11.5*  < > 9.7* 9.3* 9.0*  HCT 34.8*  < > 28.9* 27.6* 27.2*  MCV 89.5  < > 84.8 86.0 86.1  PLT 346  < > 266 257 269  < > = values in this interval not displayed.   Lipid Panel:  Recent Labs  06/22/13 0837  HDL 106     ASSESSMENT/PLAN:  Osteoarthritis S/P Right total hip arthroplasty - for Home health PT and  OT Depression - stable; continue Prozac GERD - stable; continue Prilosec Anxiety - continue Valium PRN Constipation  - no complaints; continue Miralax and Colace   I have filled out patient's discharge paperwork and written prescriptions.  Patient will receive home health PT and OT.   DME provided: [facility] [employer].  Total discharge time: Greater than 30 minutes Discharge time involved coordination of Tiffany discharge process with social worker, nursing staff and therapy department. Medical justification for home health services/DME verified.   CPT CODE: 48270   Seth Bake -  NP Tiffany Padilla [phone]

## 2013-11-11 NOTE — Progress Notes (Signed)
Patient ID: Tiffany Padilla, female   DOB: 11/19/1933, 80 y.o.   MRN: [id number]               PROGRESS NOTE  DATE: 11/02/2013  FACILITY: Nursing Home Location: [facility]  LEVEL OF CARE: SNF (31)  Acute Visit  CHIEF COMPLAINT:  Follow-up hospitalization  HISTORY OF PRESENT ILLNESS: This is a 80 year old female who has been admitted to [facility] on 11/01/13 from [facility] with Osteoarthritis S/P Right total hip arthroplasty. She has been admitted for a short-term rehabilitation.  REASSESSMENT OF ONGOING PROBLEM(S):  GERD: pt's GERD is stable.  Denies ongoing heartburn, abd. Pain, nausea or vomiting.  Currently on a PPI & tolerates it without any adverse reactions.  DEPRESSION: The depression remains stable. Patient denies ongoing feelings of sadness, insomnia, anedhonia or lack of appetite. No complications reported from the medications currently being used. Staff do not report behavioral problems.  ANXIETY: The anxiety remains stable. Patient denies ongoing anxiety or irritability. No complications reported from the medications currently being used.  PAST MEDICAL HISTORY : Reviewed.  No changes.  CURRENT MEDICATIONS: Reviewed per [facility]  REVIEW OF SYSTEMS:  GENERAL: no change in appetite, no fatigue, no weight changes, no fever, chills or weakness RESPIRATORY: no cough, SOB, DOE, wheezing, hemoptysis CARDIAC: no chest pain, edema or palpitations GI: no abdominal pain, diarrhea, heart burn, nausea or vomiting, +constipation  PHYSICAL EXAMINATION  GENERAL: no acute distress, normal body habitus EYES: conjunctivae normal, sclerae normal, normal eye lids NECK: supple, trachea midline, no neck masses, no thyroid tenderness, no thyromegaly LYMPHATICS: no LAN in the neck, no supraclavicular LAN RESPIRATORY: breathing is even & unlabored, BS CTAB CARDIAC: RRR, no murmur,no extra heart sounds, no edema GI: abdomen soft, normal BS, no masses, no  tenderness, no hepatomegaly, no splenomegaly EXTREMITIES: able to move BUE and LLE without difficulty; RLE has limited movement due to pain PSYCHIATRIC: the patient is alert & oriented to person, affect & behavior appropriate  LABS/RADIOLOGY: Labs reviewed: Basic Metabolic Panel:  Recent Labs  06/22/13 0837 10/26/13 1350 10/30/13 0415 10/31/13 0504  NA 141 141 138 144  K 4.7 4.4 4.5 4.2  CL 105 100 102 106  CO2 27 28 25 26   GLUCOSE 92 93 128* 135*  BUN 16 15 9 14   CREATININE 0.75 0.58 0.56 0.53  CALCIUM 9.3 9.2 8.6 8.7  MG 2.0  --   --   --    Liver Function Tests:  Recent Labs  06/22/13 0837 10/26/13 1350  AST 18 33  ALT 11 27  ALKPHOS 48 66  BILITOT 0.7 0.4  PROT 6.4 7.8  ALBUMIN 3.9 3.9    CBC:  Recent Labs  06/22/13 0837  10/30/13 0415 10/31/13 0504 11/01/13 0634  WBC 4.3  < > 10.1 10.8* 9.1  NEUTROABS 2.6  --   --   --   --   HGB 11.5*  < > 9.7* 9.3* 9.0*  HCT 34.8*  < > 28.9* 27.6* 27.2*  MCV 89.5  < > 84.8 86.0 86.1  PLT 346  < > 266 257 269  < > = values in this interval not displayed.   Lipid Panel:  Recent Labs  06/22/13 0837  HDL 106     ASSESSMENT/PLAN:  Osteoarthritis S/P Right total hip arthroplasty - for rehabilitation Depression - stable; continue Prozac GERD - stable; continue Prilosec Anxiety - continue Valium PRN Constipation (new) - start Colace 100 mg PO BID and  Miralax 17 gm + 6-8 oz liquid PO Q D  CPT CODE: 92426  Seth Bake - NP [facility] [phone]

## 2014-01-13 ENCOUNTER — Other Ambulatory Visit: Payer: Self-pay | Admitting: Dermatology

## 2014-01-14 ENCOUNTER — Ambulatory Visit: Payer: Medicare Other | Admitting: Internal Medicine

## 2014-07-02 ENCOUNTER — Telehealth: Payer: Self-pay | Admitting: Internal Medicine

## 2014-07-02 NOTE — Telephone Encounter (Signed)
Faxed over medical records to [facility] @ [place] @ [phone]

## 2014-10-12 ENCOUNTER — Other Ambulatory Visit: Payer: Self-pay | Admitting: Dermatology

## 2014-10-21 ENCOUNTER — Ambulatory Visit
Admission: RE | Admit: 2014-10-21 | Discharge: 2014-10-21 | Disposition: A | Discharge status: Home / Self Care | Payer: PPO | Source: Ambulatory Visit | Attending: Family Medicine | Admitting: Family Medicine

## 2014-10-21 ENCOUNTER — Other Ambulatory Visit: Payer: Self-pay | Admitting: Family Medicine

## 2014-10-21 DIAGNOSIS — R0781 Pleurodynia: Secondary | ICD-10-CM

## 2015-10-02 ENCOUNTER — Encounter: Payer: Self-pay | Admitting: Internal Medicine

## 2021-11-06 DIAGNOSIS — M4807 Spinal stenosis, lumbosacral region: Secondary | ICD-10-CM | POA: Diagnosis not present

## 2021-11-06 DIAGNOSIS — M419 Scoliosis, unspecified: Secondary | ICD-10-CM | POA: Diagnosis not present

## 2021-11-06 DIAGNOSIS — M4316 Spondylolisthesis, lumbar region: Secondary | ICD-10-CM | POA: Diagnosis not present

## 2021-11-07 ENCOUNTER — Other Ambulatory Visit: Payer: Self-pay | Admitting: Neurosurgery

## 2021-11-07 DIAGNOSIS — M4807 Spinal stenosis, lumbosacral region: Secondary | ICD-10-CM

## 2021-11-08 DIAGNOSIS — K219 Gastro-esophageal reflux disease without esophagitis: Secondary | ICD-10-CM | POA: Diagnosis not present

## 2021-11-08 DIAGNOSIS — I10 Essential (primary) hypertension: Secondary | ICD-10-CM | POA: Diagnosis not present

## 2021-11-08 DIAGNOSIS — F418 Other specified anxiety disorders: Secondary | ICD-10-CM | POA: Diagnosis not present

## 2021-11-08 DIAGNOSIS — M5136 Other intervertebral disc degeneration, lumbar region: Secondary | ICD-10-CM | POA: Diagnosis not present

## 2021-12-24 ENCOUNTER — Ambulatory Visit
Admission: RE | Admit: 2021-12-24 | Discharge: 2021-12-24 | Disposition: A | Discharge status: Home / Self Care | Payer: PPO | Source: Ambulatory Visit | Attending: Neurosurgery | Admitting: Neurosurgery

## 2021-12-24 DIAGNOSIS — M4807 Spinal stenosis, lumbosacral region: Secondary | ICD-10-CM

## 2022-01-03 DIAGNOSIS — M419 Scoliosis, unspecified: Secondary | ICD-10-CM | POA: Diagnosis not present

## 2022-01-03 DIAGNOSIS — M4807 Spinal stenosis, lumbosacral region: Secondary | ICD-10-CM | POA: Diagnosis not present

## 2022-01-08 DIAGNOSIS — M5416 Radiculopathy, lumbar region: Secondary | ICD-10-CM | POA: Diagnosis not present

## 2022-01-14 DIAGNOSIS — R293 Abnormal posture: Secondary | ICD-10-CM | POA: Diagnosis not present

## 2022-01-14 DIAGNOSIS — M545 Low back pain, unspecified: Secondary | ICD-10-CM | POA: Diagnosis not present

## 2022-01-14 DIAGNOSIS — M25552 Pain in left hip: Secondary | ICD-10-CM | POA: Diagnosis not present

## 2022-01-14 DIAGNOSIS — M6281 Muscle weakness (generalized): Secondary | ICD-10-CM | POA: Diagnosis not present

## 2022-01-16 DIAGNOSIS — R293 Abnormal posture: Secondary | ICD-10-CM | POA: Diagnosis not present

## 2022-01-16 DIAGNOSIS — M25552 Pain in left hip: Secondary | ICD-10-CM | POA: Diagnosis not present

## 2022-01-16 DIAGNOSIS — M6281 Muscle weakness (generalized): Secondary | ICD-10-CM | POA: Diagnosis not present

## 2022-01-16 DIAGNOSIS — M545 Low back pain, unspecified: Secondary | ICD-10-CM | POA: Diagnosis not present

## 2022-01-21 DIAGNOSIS — M6281 Muscle weakness (generalized): Secondary | ICD-10-CM | POA: Diagnosis not present

## 2022-01-21 DIAGNOSIS — M25552 Pain in left hip: Secondary | ICD-10-CM | POA: Diagnosis not present

## 2022-01-21 DIAGNOSIS — R293 Abnormal posture: Secondary | ICD-10-CM | POA: Diagnosis not present

## 2022-01-21 DIAGNOSIS — M545 Low back pain, unspecified: Secondary | ICD-10-CM | POA: Diagnosis not present

## 2022-01-23 DIAGNOSIS — M6281 Muscle weakness (generalized): Secondary | ICD-10-CM | POA: Diagnosis not present

## 2022-01-23 DIAGNOSIS — R293 Abnormal posture: Secondary | ICD-10-CM | POA: Diagnosis not present

## 2022-01-23 DIAGNOSIS — M545 Low back pain, unspecified: Secondary | ICD-10-CM | POA: Diagnosis not present

## 2022-01-23 DIAGNOSIS — M25552 Pain in left hip: Secondary | ICD-10-CM | POA: Diagnosis not present

## 2022-02-01 DIAGNOSIS — R293 Abnormal posture: Secondary | ICD-10-CM | POA: Diagnosis not present

## 2022-02-01 DIAGNOSIS — M25552 Pain in left hip: Secondary | ICD-10-CM | POA: Diagnosis not present

## 2022-02-01 DIAGNOSIS — M6281 Muscle weakness (generalized): Secondary | ICD-10-CM | POA: Diagnosis not present

## 2022-02-01 DIAGNOSIS — M545 Low back pain, unspecified: Secondary | ICD-10-CM | POA: Diagnosis not present

## 2022-02-04 DIAGNOSIS — M6281 Muscle weakness (generalized): Secondary | ICD-10-CM | POA: Diagnosis not present

## 2022-02-04 DIAGNOSIS — M545 Low back pain, unspecified: Secondary | ICD-10-CM | POA: Diagnosis not present

## 2022-02-04 DIAGNOSIS — R293 Abnormal posture: Secondary | ICD-10-CM | POA: Diagnosis not present

## 2022-02-04 DIAGNOSIS — M25552 Pain in left hip: Secondary | ICD-10-CM | POA: Diagnosis not present

## 2022-02-19 DIAGNOSIS — M4316 Spondylolisthesis, lumbar region: Secondary | ICD-10-CM | POA: Diagnosis not present

## 2022-02-25 DIAGNOSIS — M25552 Pain in left hip: Secondary | ICD-10-CM | POA: Diagnosis not present

## 2022-02-25 DIAGNOSIS — M545 Low back pain, unspecified: Secondary | ICD-10-CM | POA: Diagnosis not present

## 2022-02-25 DIAGNOSIS — M6281 Muscle weakness (generalized): Secondary | ICD-10-CM | POA: Diagnosis not present

## 2022-02-25 DIAGNOSIS — R293 Abnormal posture: Secondary | ICD-10-CM | POA: Diagnosis not present

## 2022-05-07 DIAGNOSIS — H02831 Dermatochalasis of right upper eyelid: Secondary | ICD-10-CM | POA: Diagnosis not present

## 2022-05-07 DIAGNOSIS — H04123 Dry eye syndrome of bilateral lacrimal glands: Secondary | ICD-10-CM | POA: Diagnosis not present

## 2022-05-07 DIAGNOSIS — H31091 Other chorioretinal scars, right eye: Secondary | ICD-10-CM | POA: Diagnosis not present

## 2022-05-07 DIAGNOSIS — H43393 Other vitreous opacities, bilateral: Secondary | ICD-10-CM | POA: Diagnosis not present

## 2022-05-07 DIAGNOSIS — Z961 Presence of intraocular lens: Secondary | ICD-10-CM | POA: Diagnosis not present

## 2022-05-07 DIAGNOSIS — H26491 Other secondary cataract, right eye: Secondary | ICD-10-CM | POA: Diagnosis not present

## 2022-05-07 DIAGNOSIS — H02834 Dermatochalasis of left upper eyelid: Secondary | ICD-10-CM | POA: Diagnosis not present

## 2022-07-23 DIAGNOSIS — R600 Localized edema: Secondary | ICD-10-CM | POA: Diagnosis not present

## 2022-07-23 DIAGNOSIS — M5410 Radiculopathy, site unspecified: Secondary | ICD-10-CM | POA: Diagnosis not present

## 2022-07-23 DIAGNOSIS — Z0000 Encounter for general adult medical examination without abnormal findings: Secondary | ICD-10-CM | POA: Diagnosis not present

## 2022-07-23 DIAGNOSIS — I10 Essential (primary) hypertension: Secondary | ICD-10-CM | POA: Diagnosis not present

## 2022-07-23 DIAGNOSIS — R3915 Urgency of urination: Secondary | ICD-10-CM | POA: Diagnosis not present

## 2022-10-28 IMAGING — MR MR LUMBAR SPINE W/O CM
4 of 5 series · 24 of 48 positions shown · non-contrast
Comparison: None.

CLINICAL DATA: Left buttock pain

EXAM:
MRI LUMBAR SPINE WITHOUT CONTRAST
TECHNIQUE: Multiplanar, multisequence MR imaging of the lumbar spine was
performed. No intravenous contrast was administered.

[Series 3: T2 · sagittal · 4.0mm · 0.53mm/px · 7 of 15 slices shown (1 of 2)]
[im 1/15]
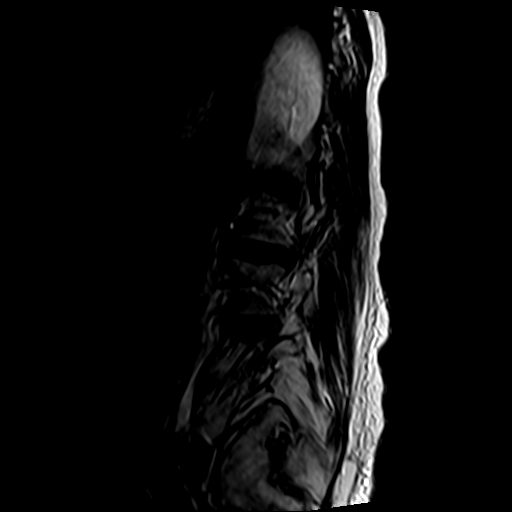
[im 3/15]
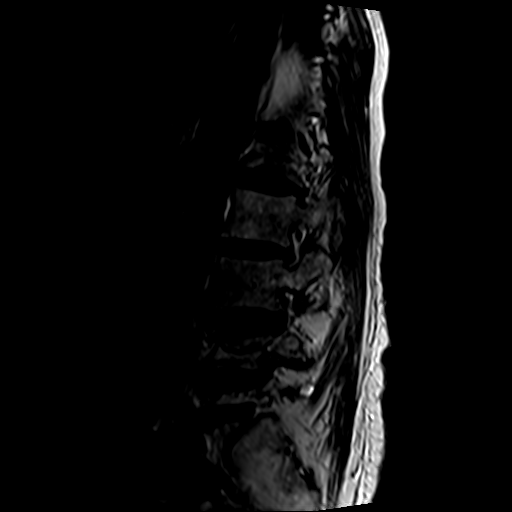
[im 5/15]
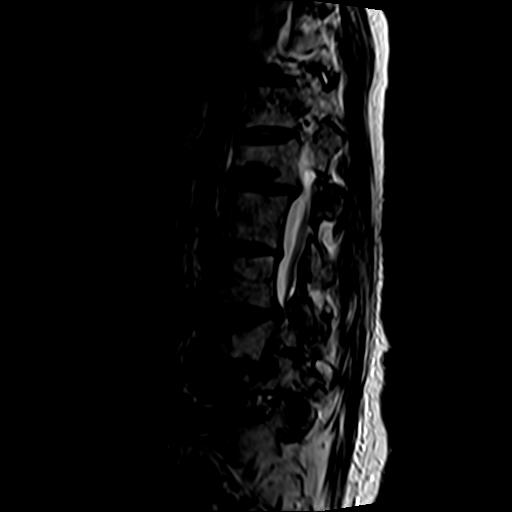
[im 8/15]
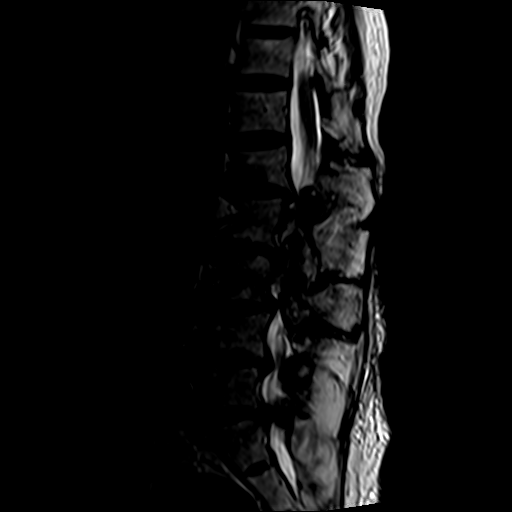
[im 10/15]
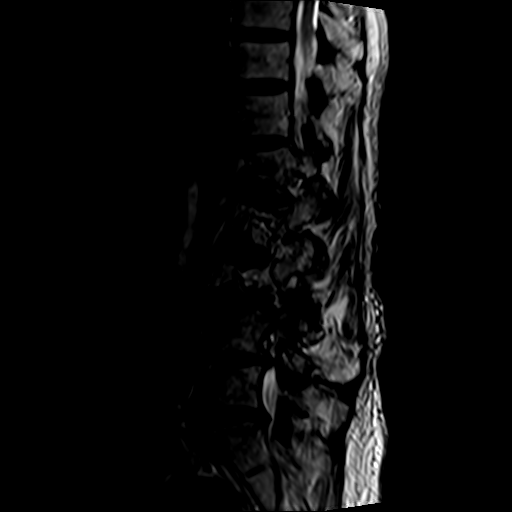
[im 12/15]
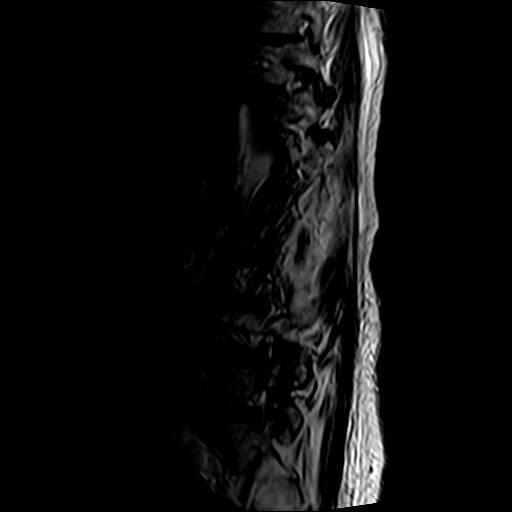
[im 15/15]
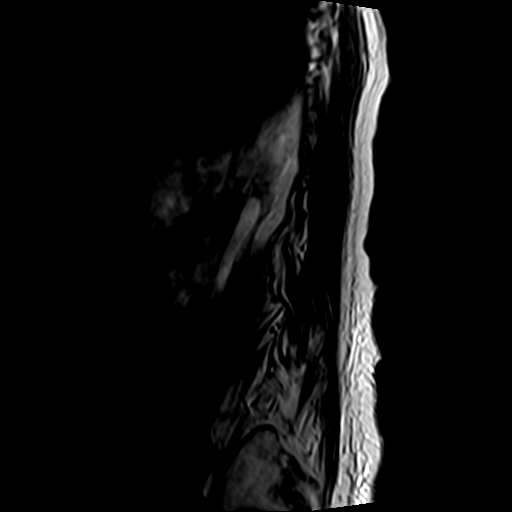

[Series 5: T1 · sagittal · 4.0mm · 0.53mm/px · 6 of 15 slices shown (1 of 2)]
[im 1/15]
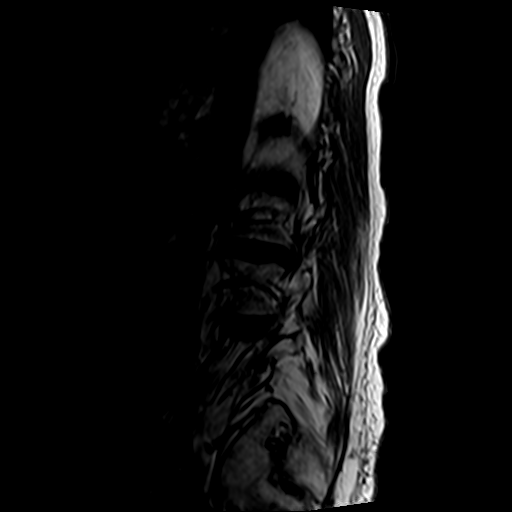
[im 3/15]
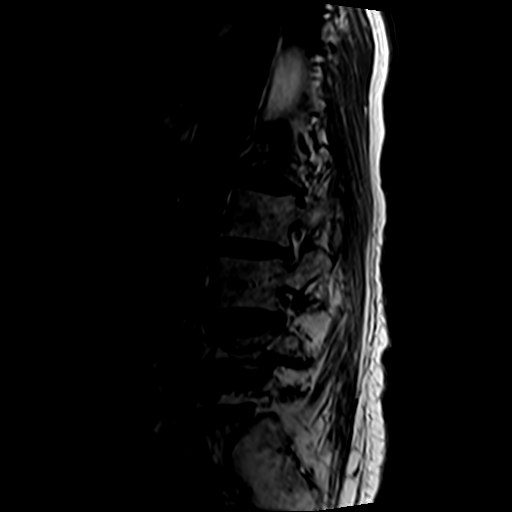
[im 6/15]
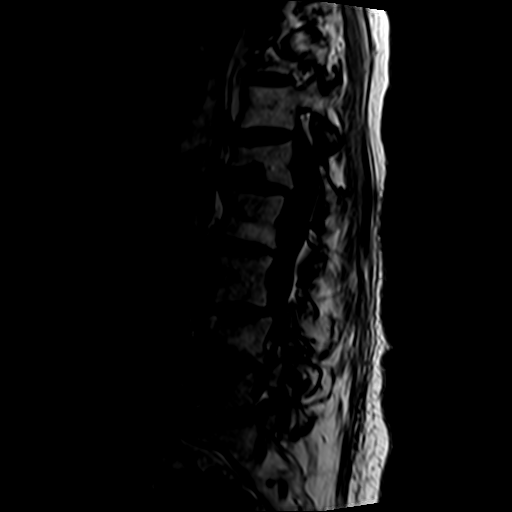
[im 9/15]
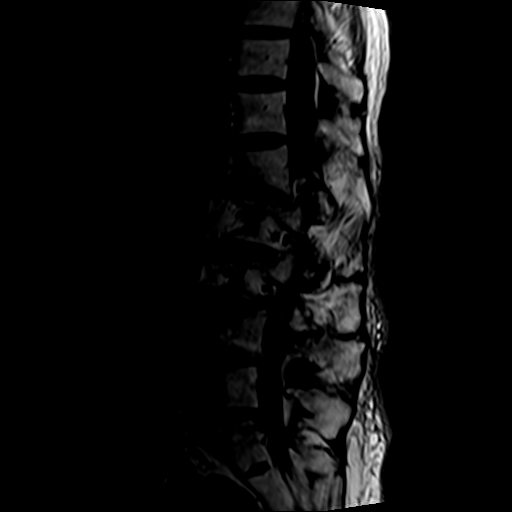
[im 12/15]
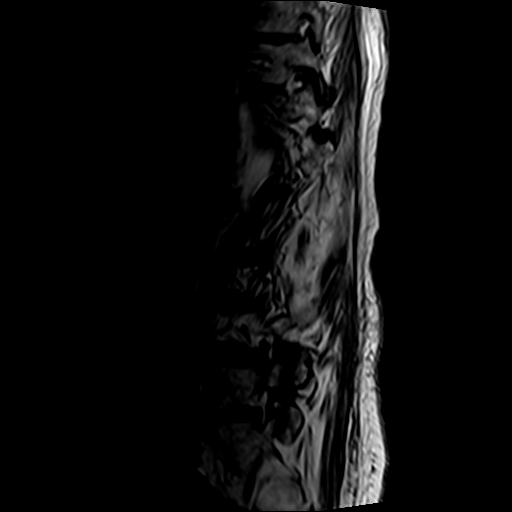
[im 15/15]
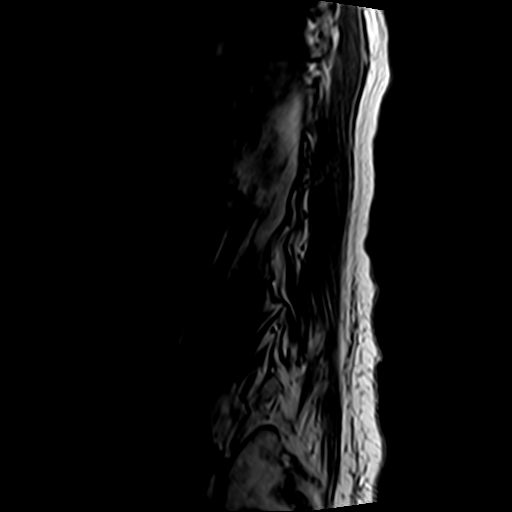

[Series 6: T2 · axial · 4.0mm · 0.70mm/px · z∈[-123,+58]mm · 8 of 33 slices shown (2 of 2)]
[im 1/33]
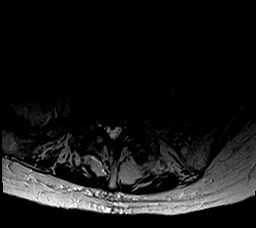
[im 5/33]
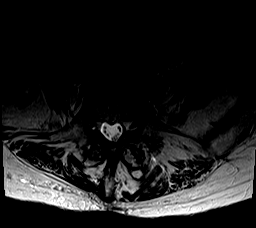
[im 10/33]
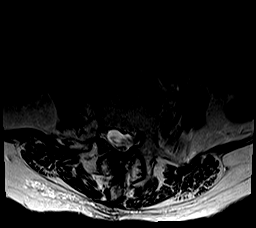
[im 15/33]
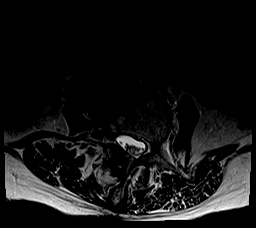
[im 18/33]
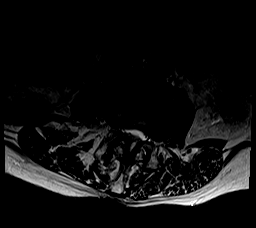
[im 23/33]
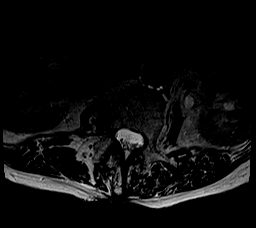
[im 28/33]
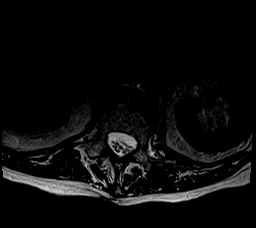
[im 33/33]
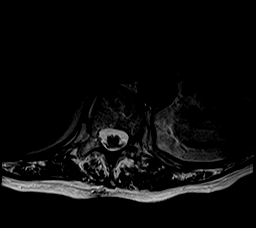

[Series 7: T1 · axial · 4.0mm · 0.35mm/px · z∈[-103,+32]mm · 3 of 33 slices shown (2 of 2)]
[im 5/33]
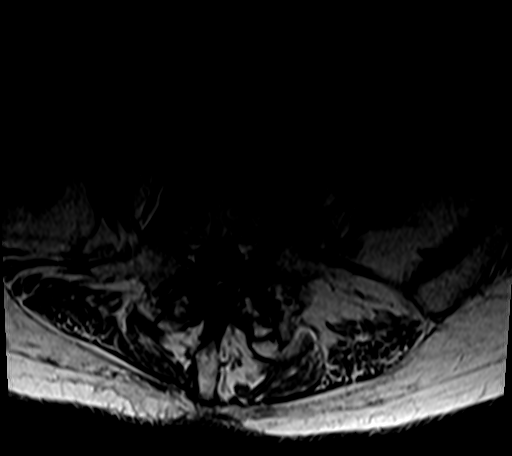
[im 18/33]
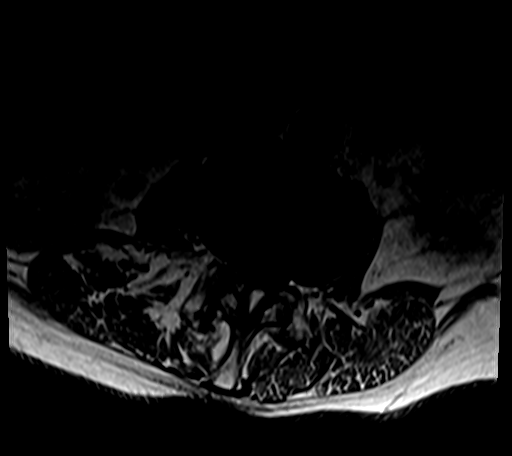
[im 28/33]
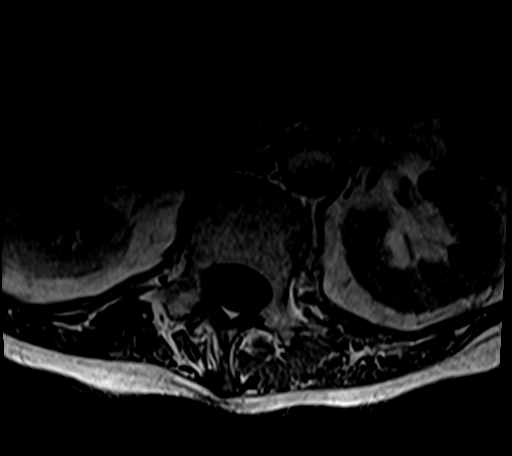

[24 of 48 positions shown; findings below may reference images not displayed]

FINDINGS: Segmentation:  Standard.

Alignment: Straightening of the lumbar lordosis. Levoconvex
curvature. Trace retrolisthesis at L1-L2. Trace anterolisthesis at
L3-L4. Trace retrolisthesis at L5-S1.

Vertebrae: No evidence of acute fracture, discitis or aggressive
osseous lesion. There is a chronic anterior compression deformity of
L1 with approximately 40% height loss.

Conus medullaris and cauda equina: Conus extends to the T12-L1
level. Conus and cauda equina appear normal.

Paraspinal and other soft tissues: Paraspinal muscle atrophy. No
other significant abnormality.

Disc levels:

T11-T12: No significant spinal canal or neural foraminal narrowing.

T12-L1: No significant spinal canal or neural foraminal narrowing.

L1-L2: Trace retrolisthesis with inferior endplate
retropulsion/spurring of L1, ligamentum flavum hypertrophy and
bilateral facet arthropathy. There is mild spinal canal stenosis.
There is mild left and moderate right neural foraminal stenosis.

L2-L3: Asymmetric right endplate spurring, ligamentum flavum
hypertrophy and bilateral facet arthropathy. There is moderate
spinal canal stenosis. There is moderate to severe right-sided
neural foraminal stenosis. No left neural foraminal stenosis.

L3-L4: Trace anterolisthesis with broad-based disc bulging,
ligamentum flavum hypertrophy and advanced bilateral facet
arthropathy. Severe spinal canal stenosis. Moderate left and severe
right neural foraminal stenosis.

L4-L5: Broad-based disc bulging, ligamentum flavum hypertrophy and
bilateral facet arthropathy. There is moderate spinal canal
stenosis, severe left and moderate right neural foraminal stenosis.

L5-S1: Trace retrolisthesis with broad-based disc bulging, ligament
flavum hypertrophy and mild facet arthropathy. There is severe left
and moderate right subarticular stenosis and moderate-severe left
neural foraminal stenosis.

Findings are overall similar in comparison to prior MRI in February 2020.
IMPRESSION: Levoconvex curvature with multilevel degenerative changes of the
lumbar spine, as summarized below:

L1-L2: Chronic anterior L1 compression deformity with 40% height
loss. Inferior endplate retropulsion/bony spurring along with
ligamentum flavum hypertrophy and mild facet arthropathy results in
mild spinal canal stenosis, mild left and moderate right neural
foraminal stenosis.

L2-L3: Moderate spinal canal stenosis. Moderate-severe right-sided
neural foraminal stenosis.

L3-L4: Severe spinal canal stenosis. Moderate left and severe right
neural foraminal stenosis.

L4-L5: Moderate spinal canal stenosis. Severe left and moderate
right neural foraminal stenosis.

L5-S1: Severe left and moderate right subarticular stenosis and
moderate-severe left-sided neural foraminal stenosis.
